# Patient Record
Sex: Male | Born: 2000
Health system: Southern US, Community
[De-identification: ages and names within clinical notes are randomized; demographics above are authoritative.]

## PROBLEM LIST (undated history)

## (undated) DIAGNOSIS — J45909 Unspecified asthma, uncomplicated: Secondary | ICD-10-CM

## (undated) DIAGNOSIS — T7840XA Allergy, unspecified, initial encounter: Secondary | ICD-10-CM

## (undated) HISTORY — DX: Allergy, unspecified, initial encounter: T78.40XA

---
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
## 2019-04-03 ENCOUNTER — Telehealth: Payer: Self-pay | Admitting: Family Medicine

## 2019-04-03 ENCOUNTER — Other Ambulatory Visit: Payer: Self-pay | Admitting: Family Medicine

## 2019-04-03 DIAGNOSIS — Z91030 Bee allergy status: Secondary | ICD-10-CM

## 2019-04-03 MED ORDER — EPINEPHRINE 0.3 MG/0.3ML IJ SOAJ: 0.3000 mg | INTRAMUSCULAR | 1 refills | Status: DC | PRN

## 2019-04-03 MED FILL — EPINEPHRINE 0.3 MG AUTO-INJ: 0.3 | 2 days supply | Qty: 2 | Fill #0

## 2019-04-03 NOTE — Telephone Encounter (Signed)
Rx sent. Thanks, BJ 

## 2019-04-03 NOTE — Telephone Encounter (Signed)
Pt's mom, Rosanne Gutting, is requesting an EpiPen for pt. Pt is allergic to bees and is currently working at [employer]. Pt just wants to be proactive in case he gets stung by a bee. Pt uses [facility]. Thanks

## 2019-04-21 MED FILL — QVAR REDIHALER 40 MCG/ACT A: 40 | 60 days supply | Qty: 11 | Fill #1

## 2019-05-12 MED FILL — MONTELUKAST SOD 10 MG TAB: 10 | 90 days supply | Qty: 90 | Fill #1

## 2019-05-15 ENCOUNTER — Encounter: Payer: Self-pay | Admitting: Family Medicine

## 2019-05-15 ENCOUNTER — Ambulatory Visit (INDEPENDENT_AMBULATORY_CARE_PROVIDER_SITE_OTHER): Payer: No Typology Code available for payment source | Admitting: Family Medicine

## 2019-05-15 ENCOUNTER — Other Ambulatory Visit: Payer: Self-pay

## 2019-05-15 VITALS — BP 126/84 | HR 87 | Temp 97.9°F | Resp 16 | Ht 70.6 in | Wt 358.0 lb

## 2019-05-15 DIAGNOSIS — Z113 Encounter for screening for infections with a predominantly sexual mode of transmission: Secondary | ICD-10-CM | POA: Diagnosis not present

## 2019-05-15 DIAGNOSIS — Z202 Contact with and (suspected) exposure to infections with a predominantly sexual mode of transmission: Secondary | ICD-10-CM | POA: Diagnosis not present

## 2019-05-15 NOTE — Addendum Note (Signed)
Addended by: Bonnye Fava on: 05/15/2019 04:01 PM   Modules accepted: Orders

## 2019-05-15 NOTE — Progress Notes (Signed)
ACUTE VISIT Chief Complaint  Patient presents with  . STD testing    recent exposure to HIV and Herpes   HPI: [name] is a 19 y.o. male, who is here today with above concern. Recently he learned that a male he had sex intercourse with in 12/2018 may be HIV positive and has hx of herpes. He states that he did wear condoms at that time and he always does.. She was his only sex partner at that time. He does not tell me how many he has a this time.  He has not noted fever, night sweats, abnormal weight loss, abdominal pain, diarrhea, urinary symptoms, urethral discharge, a skin rash, or genital lesion. No history of STDs.  Review of Systems  Constitutional: Negative for activity change, appetite change and fatigue.  HENT: Negative for mouth sores and sore throat.   Eyes: Negative for redness and visual disturbance.  Gastrointestinal: Negative for nausea and vomiting.  Genitourinary: Negative for decreased urine volume, dysuria, penile pain and testicular pain.  Musculoskeletal: Negative for gait problem and myalgias.  Skin: Negative for wound.  Neurological: Negative for syncope, weakness, numbness and headaches.  Hematological: Negative for adenopathy. Does not bruise/bleed easily.  Rest see pertinent positives and negatives per HPI.  Current Outpatient Medications on File Prior to Visit  Medication Sig Dispense Refill  . albuterol (PROVENTIL HFA;VENTOLIN HFA) 108 (90 Base) MCG/ACT inhaler Inhale 2 puffs into the lungs every 6 (six) hours as needed for wheezing or shortness of breath. 18 g 1  . beclomethasone (QVAR REDIHALER) 40 MCG/ACT inhaler Inhale 1 puff into the lungs 2 (two) times daily. 10.6 g 6  . cetirizine (ZYRTEC) 10 MG tablet Take 10 mg by mouth daily.    Marland Kitchen EPINEPHrine 0.3 mg/0.3 mL IJ SOAJ injection Inject 0.3 mLs (0.3 mg total) into the muscle as needed for anaphylaxis. 1 each 1  . montelukast (SINGULAIR) 10 MG tablet Take 1 tablet (10 mg  total) by mouth at bedtime. 90 tablet 2   No current facility-administered medications on file prior to visit.     Past Medical History:  Diagnosis Date  . Allergy   . Asthma    Allergies  Allergen Reactions  . Amoxicillin Other (See Comments)    Reaction unknown    Social History   Socioeconomic History  . Marital status: Single    Spouse name: Not on file  . Number of children: Not on file  . Years of education: Not on file  . Highest education level: Not on file  Occupational History  . Not on file  Tobacco Use  . Smoking status: Never Smoker  . Smokeless tobacco: Never Used  Substance and Sexual Activity  . Alcohol use: No  . Drug use: No  . Sexual activity: Yes    Partners: Female  Other Topics Concern  . Not on file  Social History Narrative  . Not on file   Social Determinants of Health   Financial Resource Strain:   . Difficulty of Paying Living Expenses:   Food Insecurity:   . Worried About Charity fundraiser in the Last Year:   . Arboriculturist in the Last Year:   Transportation Needs:   . Film/video editor (Medical):   Marland Kitchen Lack of Transportation (Non-Medical):   Physical Activity:   . Days of Exercise per Week:   . Minutes of Exercise per Session:   Stress:   .  Feeling of Stress :   Social Connections:   . Frequency of Communication with Friends and Family:   . Frequency of Social Gatherings with Friends and Family:   . Attends Religious Services:   . Active Member of Clubs or Organizations:   . Attends Banker Meetings:   Marland Kitchen Marital Status:     Vitals:   05/15/19 1502  BP: 126/84  Pulse: 87  Temp: 97.9 F (36.6 C)  SpO2: 98%   Body mass index is 50.5 kg/m.  Physical Exam  Nursing note and vitals reviewed. Constitutional: He is oriented to person, place, and time. He appears well-developed. No distress.  HENT:  Head: Normocephalic and atraumatic.  Eyes: Conjunctivae are normal.  Cardiovascular: Normal rate  and regular rhythm.  No murmur heard. Respiratory: Effort normal and breath sounds normal. No respiratory distress.  GI: Soft. He exhibits no mass. There is no hepatomegaly. There is no abdominal tenderness.  Lymphadenopathy:    He has no cervical adenopathy.  Neurological: He is alert and oriented to person, place, and time. He has normal strength. No cranial nerve deficit. Gait normal.  Skin: Skin is warm. No rash noted. No erythema.  Psychiatric: He has a normal mood and affect. Cognition and memory are normal.  Well groomed, good eye contact.   ASSESSMENT AND PLAN:  Benjamin Berry was seen today for std testing.  Diagnoses and all orders for this visit:  Screen for STD (sexually transmitted disease) -     Urine cytology ancillary only -     HIV Antibody (routine testing w rflx) -     RPR -     HSV 2 Antibody, IgG  Possible exposure to STD   He is not certain about HIV exposure. STD prevention discussed. Encouraged to wear condoms every time he has sex intercourse, if possible abstinence ifs preferable.  Further recommendation will be given according to lab results.  Return if symptoms worsen or fail to improve.   [name], MD  [facility]. [place] office.  Discharge Instructions   None     A few things to remember from today's visit:   Screen for STD (sexually transmitted disease) - Plan: Urine cytology ancillary only, HIV Antibody (routine testing w rflx), RPR, HSV 2 Antibody, IgG  Possible exposure to STD   Safe Sex Practicing safe sex means taking steps before and during sex to reduce your risk of:  Getting an STI (sexually transmitted infection).  Giving your partner an STI.  Unwanted or unplanned pregnancy. How can I practice safe sex?     Ways you can practice safe sex  Limit your sexual partners to only one partner who is having sex with only you.  Avoid using alcohol and drugs before having sex. Alcohol and drugs can affect  your judgment.  Before having sex with a new partner: ? Talk to your partner about past partners, past STIs, and drug use. ? Get screened for STIs and discuss the results with your partner. Ask your partner to get screened, too.  Check your body regularly for sores, blisters, rashes, or unusual discharge. If you notice any of these problems, visit your health care provider.  Avoid sexual contact if you have symptoms of an infection or you are being treated for an STI.  While having sex, use a condom. Make sure to: ? Use a condom every time you have vaginal, oral, or anal sex. Both females and males should wear condoms during oral sex. ?  Keep condoms in place from the beginning to the end of sexual activity. ? Use a latex condom, if possible. Latex condoms offer the best protection. ? Use only water-based lubricants with a condom. Using petroleum-based lubricants or oils will weaken the condom and increase the chance that it will break. Ways your health care provider can help you practice safe sex  See your health care provider for regular screenings, exams, and tests for STIs.  Talk with your health care provider about what kind of birth control (contraception) is best for you.  Get vaccinated against hepatitis B and human papillomavirus (HPV).  If you are at risk of being infected with HIV (human immunodeficiency virus), talk with your health care provider about taking a prescription medicine to prevent HIV infection. You are at risk for HIV if you: ? Are a man who has sex with other men. ? Are sexually active with more than one partner. ? Take drugs by injection. ? Have a sex partner who has HIV. ? Have unprotected sex. ? Have sex with someone who has sex with both men and women. ? Have had an STI. Follow these instructions at home:  Take over-the-counter and prescription medicines as told by your health care provider.  Keep all follow-up visits as told by your health care  provider. This is important. Where to find more information  Centers for Disease Control and Prevention: LessFurniture.be  Planned Parenthood: https://www.plannedparenthood.org/  Office on Women's Health: EmploymentTracking.tn Summary  Practicing safe sex means taking steps before and during sex to reduce your risk of STIs, giving your partner STIs, and having an unwanted or unplanned pregnancy.  Before having sex with a new partner, talk to your partner about past partners, past STIs, and drug use.  Use a condom every time you have vaginal, oral, or anal sex. Both females and males should wear condoms during oral sex.  Check your body regularly for sores, blisters, rashes, or unusual discharge. If you notice any of these problems, visit your health care provider.  See your health care provider for regular screenings, exams, and tests for STIs. This information is not intended to replace advice given to you by your health care provider. Make sure you discuss any questions you have with your health care provider. Document Revised: 04/18/2018 Document Reviewed: 10/07/2017 Elsevier Patient Education  [employer].  If you need refills please call your pharmacy. Do not use My Chart to request refills or for acute issues that need immediate attention.    Please be sure medication list is accurate. If a new problem present, please set up appointment sooner than planned today.

## 2019-05-15 NOTE — Patient Instructions (Signed)
A few things to remember from today's visit:   Screen for STD (sexually transmitted disease) - Plan: Urine cytology ancillary only, HIV Antibody (routine testing w rflx), RPR, HSV 2 Antibody, IgG  Possible exposure to STD   Safe Sex Practicing safe sex means taking steps before and during sex to reduce your risk of:  Getting an STI (sexually transmitted infection).  Giving your partner an STI.  Unwanted or unplanned pregnancy. How can I practice safe sex?     Ways you can practice safe sex  Limit your sexual partners to only one partner who is having sex with only you.  Avoid using alcohol and drugs before having sex. Alcohol and drugs can affect your judgment.  Before having sex with a new partner: ? Talk to your partner about past partners, past STIs, and drug use. ? Get screened for STIs and discuss the results with your partner. Ask your partner to get screened, too.  Check your body regularly for sores, blisters, rashes, or unusual discharge. If you notice any of these problems, visit your health care provider.  Avoid sexual contact if you have symptoms of an infection or you are being treated for an STI.  While having sex, use a condom. Make sure to: ? Use a condom every time you have vaginal, oral, or anal sex. Both females and males should wear condoms during oral sex. ? Keep condoms in place from the beginning to the end of sexual activity. ? Use a latex condom, if possible. Latex condoms offer the best protection. ? Use only water-based lubricants with a condom. Using petroleum-based lubricants or oils will weaken the condom and increase the chance that it will break. Ways your health care provider can help you practice safe sex  See your health care provider for regular screenings, exams, and tests for STIs.  Talk with your health care provider about what kind of birth control (contraception) is best for you.  Get vaccinated against hepatitis B and human  papillomavirus (HPV).  If you are at risk of being infected with HIV (human immunodeficiency virus), talk with your health care provider about taking a prescription medicine to prevent HIV infection. You are at risk for HIV if you: ? Are a man who has sex with other men. ? Are sexually active with more than one partner. ? Take drugs by injection. ? Have a sex partner who has HIV. ? Have unprotected sex. ? Have sex with someone who has sex with both men and women. ? Have had an STI. Follow these instructions at home:  Take over-the-counter and prescription medicines as told by your health care provider.  Keep all follow-up visits as told by your health care provider. This is important. Where to find more information  Centers for Disease Control and Prevention: PinkCheek.be  Planned Parenthood: https://www.plannedparenthood.org/  Office on Women's Health: BasketballVoice.it Summary  Practicing safe sex means taking steps before and during sex to reduce your risk of STIs, giving your partner STIs, and having an unwanted or unplanned pregnancy.  Before having sex with a new partner, talk to your partner about past partners, past STIs, and drug use.  Use a condom every time you have vaginal, oral, or anal sex. Both females and males should wear condoms during oral sex.  Check your body regularly for sores, blisters, rashes, or unusual discharge. If you notice any of these problems, visit your health care provider.  See your health care provider for regular screenings, exams, and tests  for STIs. This information is not intended to replace advice given to you by your health care provider. Make sure you discuss any questions you have with your health care provider. Document Revised: 04/18/2018 Document Reviewed: 10/07/2017 Elsevier Patient Education  [employer].  If you need refills please call  your pharmacy. Do not use My Chart to request refills or for acute issues that need immediate attention.    Please be sure medication list is accurate. If a new problem present, please set up appointment sooner than planned today.

## 2019-05-18 LAB — HIV ANTIBODY (ROUTINE TESTING W REFLEX): HIV 1&2 Ab, 4th Generation: NONREACTIVE

## 2019-05-18 LAB — HSV 2 ANTIBODY, IGG: HSV 2 Glycoprotein G Ab, IgG: 0.98 index — ABNORMAL HIGH

## 2019-05-18 LAB — C. TRACHOMATIS/N. GONORRHOEAE RNA
C. trachomatis RNA, TMA: NOT DETECTED
N. gonorrhoeae RNA, TMA: NOT DETECTED

## 2019-05-18 LAB — RPR: RPR Ser Ql: NONREACTIVE

## 2019-08-17 ENCOUNTER — Ambulatory Visit: Payer: No Typology Code available for payment source | Admitting: Family Medicine

## 2019-08-24 ENCOUNTER — Other Ambulatory Visit: Payer: Self-pay

## 2019-08-24 ENCOUNTER — Encounter: Payer: Self-pay | Admitting: Family Medicine

## 2019-08-24 ENCOUNTER — Ambulatory Visit (INDEPENDENT_AMBULATORY_CARE_PROVIDER_SITE_OTHER): Payer: No Typology Code available for payment source | Admitting: Family Medicine

## 2019-08-24 VITALS — BP 128/80 | HR 74 | Resp 12 | Ht 70.6 in | Wt 339.0 lb

## 2019-08-24 DIAGNOSIS — J4520 Mild intermittent asthma, uncomplicated: Secondary | ICD-10-CM | POA: Diagnosis not present

## 2019-08-24 DIAGNOSIS — J309 Allergic rhinitis, unspecified: Secondary | ICD-10-CM | POA: Diagnosis not present

## 2019-08-24 MED ORDER — BUDESONIDE-FORMOTEROL FUMARATE 160-4.5 MCG/ACT IN AERO: 2.0000 | INHALATION_SPRAY | Freq: Two times a day (BID) | RESPIRATORY_TRACT | 3 refills | Status: DC

## 2019-08-24 MED ORDER — MONTELUKAST SODIUM 10 MG PO TABS: 10.0000 mg | ORAL_TABLET | Freq: Every day | ORAL | 2 refills | Status: DC

## 2019-08-24 NOTE — Assessment & Plan Note (Signed)
Problem is well controlled. Continue Symbicort 160-4.5 mcg twice daily and albuterol inhaler 2 puff every 4-6 hours as needed. Weight loss will help and strongly recommended. He can follow in 1 year, before if needed.

## 2019-08-24 NOTE — Assessment & Plan Note (Signed)
He has lost about 19 pounds since his last visit in 05/2019. Encouraged to be consistent with regular physical activity and a healthful diet. We discussed benefits of weight loss.

## 2019-08-24 NOTE — Assessment & Plan Note (Signed)
Problem is well controlled. Continue Singulair 10 mg daily and nasal saline irrigations as needed. Recommend taking Singulair during months,when symptoms are more prevalent. Continue annual follow-ups.

## 2019-08-24 NOTE — Progress Notes (Signed)
HPI: Benjamin Berry is a 19 y.o. male, who is here today for 6 months follow up.   He was last seen on 05/15/2019 for acute visit. Last follow-up on 12/29/2018, Qvar was recommended. His mother has Symbicort, which he used and felt like it helped more than Qvar.  Currently he is on Symbicort 160-4.5 mcg twice daily. He is using Albuterol inh 0-3 times per week. Negative for cough, dyspnea, wheezing, or CP.  Negative for nocturnal symptoms.  Symptoms are exacerbated by physical activity in humid weather.  Allergy rhinitis: He is also on Singulair 10 mg daily. At this time he is not having nasal congestion, rhinorrhea, or postnasal drainage.  He has increased physical activity and try to eat healthier, therefore he has lost some weight.  Review of Systems  Constitutional: Negative for activity change, appetite change, fatigue and fever.  HENT: Negative for mouth sores, nosebleeds and sore throat.   Eyes: Negative for redness and visual disturbance.  Gastrointestinal: Negative for abdominal pain, nausea and vomiting.  Skin: Negative for pallor and rash.  Allergic/Immunologic: Positive for environmental allergies.  Neurological: Negative for syncope, weakness and headaches.  Rest of ROS, see pertinent positives sand negatives in HPI  Current Outpatient Medications on File Prior to Visit  Medication Sig Dispense Refill  . albuterol (PROVENTIL HFA;VENTOLIN HFA) 108 (90 Base) MCG/ACT inhaler Inhale 2 puffs into the lungs every 6 (six) hours as needed for wheezing or shortness of breath. 18 g 1  . cetirizine (ZYRTEC) 10 MG tablet Take 10 mg by mouth daily.    Marland Kitchen EPINEPHrine 0.3 mg/0.3 mL IJ SOAJ injection Inject 0.3 mLs (0.3 mg total) into the muscle as needed for anaphylaxis. 1 each 1   No current facility-administered medications on file prior to visit.   Past Medical History:  Diagnosis Date  . Allergy   . Asthma    Allergies  Allergen Reactions  . Amoxicillin Other (See  Comments)    Reaction unknown    Social History   Socioeconomic History  . Marital status: Single    Spouse name: Not on file  . Number of children: Not on file  . Years of education: Not on file  . Highest education level: Not on file  Occupational History  . Not on file  Tobacco Use  . Smoking status: Never Smoker  . Smokeless tobacco: Never Used  Vaping Use  . Vaping Use: Never used  Substance and Sexual Activity  . Alcohol use: No  . Drug use: No  . Sexual activity: Yes    Partners: Female  Other Topics Concern  . Not on file  Social History Narrative  . Not on file   Social Determinants of Health   Financial Resource Strain:   . Difficulty of Paying Living Expenses:   Food Insecurity:   . Worried About Programme researcher, broadcasting/film/video in the Last Year:   . Barista in the Last Year:   Transportation Needs:   . Freight forwarder (Medical):   Marland Kitchen Lack of Transportation (Non-Medical):   Physical Activity:   . Days of Exercise per Week:   . Minutes of Exercise per Session:   Stress:   . Feeling of Stress :   Social Connections:   . Frequency of Communication with Friends and Family:   . Frequency of Social Gatherings with Friends and Family:   . Attends Religious Services:   . Active Member of Clubs or Organizations:   .  Attends Banker Meetings:   Marland Kitchen Marital Status:    Vitals:   08/24/19 1616  BP: 128/80  Pulse: 74  Resp: 12  SpO2: 97%   Wt Readings from Last 3 Encounters:  08/24/19 (!) 339 lb (153.8 kg)  05/15/19 (!) 358 lb (162.4 kg)  12/29/18 (!) 356 lb (161.5 kg) (>99 %, Z= 3.60)*   * Growth percentiles are based on CDC (Boys, 2-20 Years) data.   Body mass index is 47.82 kg/m.  Physical Exam Nursing note reviewed.  Constitutional:      General: He is not in acute distress.    Appearance: He is well-developed.  HENT:     Head: Normocephalic and atraumatic.     Mouth/Throat:     Mouth: Mucous membranes are moist.     Pharynx:  Oropharynx is clear.  Eyes:     Conjunctiva/sclera: Conjunctivae normal.     Pupils: Pupils are equal, round, and reactive to light.  Cardiovascular:     Rate and Rhythm: Normal rate and regular rhythm.     Heart sounds: No murmur heard.   Pulmonary:     Effort: Pulmonary effort is normal. No respiratory distress.     Breath sounds: Normal breath sounds.  Lymphadenopathy:     Cervical: No cervical adenopathy.  Skin:    General: Skin is warm.     Findings: No erythema or rash.  Neurological:     Mental Status: He is alert and oriented to person, place, and time.     Cranial Nerves: No cranial nerve deficit.     Gait: Gait normal.  Psychiatric:        Mood and Affect: Mood and affect normal.     Comments: Well groomed, good eye contact.   ASSESSMENT AND PLAN:  Benjamin Berry was seen today for 6 months follow-up.  No orders of the defined types were placed in this encounter.  Morbid obesity (HCC) He has lost about 19 pounds since his last visit in 05/2019. Encouraged to be consistent with regular physical activity and a healthful diet. We discussed benefits of weight loss.  Asthma, intermittent, uncomplicated Problem is well controlled. Continue Symbicort 160-4.5 mcg twice daily and albuterol inhaler 2 puff every 4-6 hours as needed. Weight loss will help and strongly recommended. He can follow in 1 year, before if needed.  Allergic rhinitis Problem is well controlled. Continue Singulair 10 mg daily and nasal saline irrigations as needed. Recommend taking Singulair during months,when symptoms are more prevalent. Continue annual follow-ups.   Return in about 1 year (around 08/23/2020) for cpe and f/u.  Sherie Dobrowolski G. [place], MD  [facility]. [place] office.

## 2019-08-24 NOTE — Patient Instructions (Signed)
A few things to remember from today's visit: No changes today. Continue regular physical activity , 150 min per week.  If you need refills please call your pharmacy. Do not use My Chart to request refills or for acute issues that need immediate attention.    Please be sure medication list is accurate. If a new problem present, please set up appointment sooner than planned today.

## 2019-08-27 ENCOUNTER — Telehealth: Payer: Self-pay | Admitting: Family Medicine

## 2019-08-27 DIAGNOSIS — J4520 Mild intermittent asthma, uncomplicated: Secondary | ICD-10-CM

## 2019-08-27 DIAGNOSIS — J309 Allergic rhinitis, unspecified: Secondary | ICD-10-CM

## 2019-08-27 NOTE — Telephone Encounter (Signed)
Pt medication should have been sent to [facility] out patient. Wants it transferred there.  budesonide-formoterol (SYMBICORT) 160-4.5 MCG/ACT inhaler   montelukast (SINGULAIR) 10 MG tablet   Please advise

## 2019-08-28 ENCOUNTER — Other Ambulatory Visit: Payer: Self-pay | Admitting: Family Medicine

## 2019-08-28 MED ORDER — BUDESONIDE-FORMOTEROL FUMARATE 160-4.5 MCG/ACT IN AERO: 2.0000 | INHALATION_SPRAY | Freq: Two times a day (BID) | RESPIRATORY_TRACT | 3 refills | Status: DC

## 2019-08-28 MED ORDER — MONTELUKAST SODIUM 10 MG PO TABS: 10.0000 mg | ORAL_TABLET | Freq: Every day | ORAL | 2 refills | Status: DC

## 2019-08-28 MED FILL — SYMBICORT 160-4.5 MCG INH: 160-4.5 | 30 days supply | Qty: 10 | Fill #0

## 2019-08-28 MED FILL — MONTELUKAST SOD 10 MG TAB: 10 | 90 days supply | Qty: 90 | Fill #0

## 2019-08-28 NOTE — Telephone Encounter (Signed)
Rx's sent in as requested. 

## 2019-09-23 MED FILL — MONTELUKAST SOD 10 MG TAB: 10 | 90 days supply | Qty: 90 | Fill #2

## 2019-11-10 MED FILL — SYMBICORT 160-4.5 MCG INH: 160-4.5 | 30 days supply | Qty: 10 | Fill #1

## 2019-12-14 MED FILL — SYMBICORT 160-4.5 MCG INH: 160-4.5 | 30 days supply | Qty: 10 | Fill #2

## 2020-02-02 MED FILL — MONTELUKAST SOD 10 MG TAB: 10 | 90 days supply | Qty: 90 | Fill #0

## 2020-02-24 ENCOUNTER — Encounter: Payer: Self-pay | Admitting: Family Medicine

## 2020-02-24 ENCOUNTER — Ambulatory Visit (INDEPENDENT_AMBULATORY_CARE_PROVIDER_SITE_OTHER): Payer: 59 | Admitting: Family Medicine

## 2020-02-24 ENCOUNTER — Encounter: Payer: No Typology Code available for payment source | Admitting: Family Medicine

## 2020-02-24 ENCOUNTER — Other Ambulatory Visit: Payer: Self-pay

## 2020-02-24 VITALS — BP 128/80 | HR 69 | Resp 12 | Ht 70.6 in | Wt 333.4 lb

## 2020-02-24 DIAGNOSIS — Z23 Encounter for immunization: Secondary | ICD-10-CM | POA: Diagnosis not present

## 2020-02-24 DIAGNOSIS — Z0000 Encounter for general adult medical examination without abnormal findings: Secondary | ICD-10-CM | POA: Diagnosis not present

## 2020-02-24 DIAGNOSIS — Z13228 Encounter for screening for other metabolic disorders: Secondary | ICD-10-CM | POA: Diagnosis not present

## 2020-02-24 DIAGNOSIS — Z130 Encounter for screening for diseases of the blood and blood-forming organs and certain disorders involving the immune mechanism: Secondary | ICD-10-CM

## 2020-02-24 DIAGNOSIS — Z1159 Encounter for screening for other viral diseases: Secondary | ICD-10-CM

## 2020-02-24 DIAGNOSIS — J4520 Mild intermittent asthma, uncomplicated: Secondary | ICD-10-CM

## 2020-02-24 DIAGNOSIS — Z13220 Encounter for screening for lipoid disorders: Secondary | ICD-10-CM | POA: Diagnosis not present

## 2020-02-24 DIAGNOSIS — Z1329 Encounter for screening for other suspected endocrine disorder: Secondary | ICD-10-CM | POA: Diagnosis not present

## 2020-02-24 LAB — HEPATITIS C ANTIBODY
Hepatitis C Ab: NONREACTIVE
SIGNAL TO CUT-OFF: 0.05 (ref <1.00)

## 2020-02-24 LAB — BASIC METABOLIC PANEL
BUN: 15 mg/dL (ref 6–23)
CO2: 25 mEq/L (ref 19–32)
Calcium: 9.6 mg/dL (ref 8.4–10.5)
Chloride: 105 mEq/L (ref 96–112)
Creatinine, Ser: 0.89 mg/dL (ref 0.40–1.50)
GFR: 122.98 mL/min (ref >60.00)
Glucose, Bld: 86 mg/dL (ref 70–99)
Potassium: 4.1 mEq/L (ref 3.5–5.1)
Sodium: 137 mEq/L (ref 135–145)

## 2020-02-24 LAB — LIPID PANEL
Cholesterol: 153 mg/dL (ref 0–200)
HDL: 48.9 mg/dL (ref >39.00)
LDL Cholesterol: 88 mg/dL (ref 0–99)
NonHDL: 103.65
Total CHOL/HDL Ratio: 3
Triglycerides: 76 mg/dL (ref 0.0–149.0)
VLDL: 15.2 mg/dL (ref 0.0–40.0)

## 2020-02-24 LAB — HEMOGLOBIN A1C: Hgb A1c MFr Bld: 5.6 % (ref 4.6–6.5)

## 2020-02-24 MED ORDER — BUDESONIDE-FORMOTEROL FUMARATE 160-4.5 MCG/ACT IN AERO
2.0000 | INHALATION_SPRAY | Freq: Two times a day (BID) | RESPIRATORY_TRACT | 3 refills | Status: DC
Start: 2020-02-24 — End: 2020-02-26

## 2020-02-24 NOTE — Patient Instructions (Addendum)
A few things to remember from today's visit:  Routine general medical examination at a health care facility  Encounter for HCV screening test for low risk patient - Plan: Hepatitis C antibody screen  Asthma, intermittent, uncomplicated  Screening for lipoid disorders - Plan: Lipid panel  Screening for endocrine, metabolic and immunity disorder - Plan: Hemoglobin A1c, Basic metabolic panel  If you need refills please call your pharmacy. Do not use My Chart to request refills or for acute issues that need immediate attention.   Please be sure medication list is accurate. If a new problem present, please set up appointment sooner than planned today.  At least 150 minutes of moderate exercise per week, daily brisk walking for 15-30 min is a good exercise option. Healthy diet low in saturated (animal) fats and sweets and consisting of fresh fruits and vegetables, lean meats such as fish and white chicken and whole grains.  - Vaccines:  Tdap vaccine every 10 years.  Shingles vaccine recommended at age 90, could be given after 21 years of age but not sure about insurance coverage.  Pneumonia vaccines: Pneumovax at 27  -Screening recommendations for low/normal risk males:  Screening for diabetes at age 30 and every 3 years. Earlier screening if cardiovascular risk factors.   Lipid screening at 35 and every 3 years. Screening starts in younger males with cardiovascular risk factors.  Colon cancer screening is now at age 7 but your insurance may not cover until age 75 .screening is recommended age 47.  Prostate cancer screening: some controversy, starts usually at 50: Rectal exam and PSA.  Aortic Abdominal Aneurism once between 74 and 57 years old if ever smoker.  Also recommended:  1. Dental visit- Brush and floss your teeth twice daily; visit your dentist twice a year. 2. Eye doctor- Get an eye exam at least every 2 years. 3. Helmet use- Always wear a helmet when riding a  bicycle, motorcycle, rollerblading or skateboarding. 4. Safe sex- If you may be exposed to sexually transmitted infections, use a condom. 5. Seat belts- Seat belts can save your live; always wear one. 6. Smoke/Carbon Monoxide detectors- These detectors need to be installed on the appropriate level of your home. Replace batteries at least once a year. 7. Skin cancer- When out in the sun please cover up and use sunscreen 15 SPF or higher. 8. Violence- If anyone is threatening or hurting you, please tell your healthcare provider.  9. Drink alcohol in moderation- Limit alcohol intake to one drink or less per day. Never drink and drive.     Why follow it? Research shows. . Those who follow the Mediterranean diet have a reduced risk of heart disease  . The diet is associated with a reduced incidence of Parkinson's and Alzheimer's diseases . People following the diet may have longer life expectancies and lower rates of chronic diseases  . The Dietary Guidelines for Americans recommends the Mediterranean diet as an eating plan to promote health and prevent disease  What Is the Mediterranean Diet?  . Healthy eating plan based on typical foods and recipes of Mediterranean-style cooking . The diet is primarily a plant based diet; these foods should make up a majority of meals   Starches - Plant based foods should make up a majority of meals - They are an important sources of vitamins, minerals, energy, antioxidants, and fiber - Choose whole grains, foods high in fiber and minimally processed items  - Typical grain sources include wheat, oats, barley, corn,  brown rice, bulgar, farro, millet, polenta, couscous  - Various types of beans include chickpeas, lentils, fava beans, black beans, white beans   Fruits  Veggies - Large quantities of antioxidant rich fruits & veggies; 6 or more servings  - Vegetables can be eaten raw or lightly drizzled with oil and cooked  - Vegetables common to the traditional  Mediterranean Diet include: artichokes, arugula, beets, broccoli, brussel sprouts, cabbage, carrots, celery, collard greens, cucumbers, eggplant, kale, leeks, lemons, lettuce, mushrooms, okra, onions, peas, peppers, potatoes, pumpkin, radishes, rutabaga, shallots, spinach, sweet potatoes, turnips, zucchini - Fruits common to the Mediterranean Diet include: apples, apricots, avocados, cherries, clementines, dates, figs, grapefruits, grapes, melons, nectarines, oranges, peaches, pears, pomegranates, strawberries, tangerines  Fats - Replace butter and margarine with healthy oils, such as olive oil, canola oil, and tahini  - Limit nuts to no more than a handful a day  - Nuts include walnuts, almonds, pecans, pistachios, pine nuts  - Limit or avoid candied, honey roasted or heavily salted nuts - Olives are central to the [employer] - can be eaten whole or used in a variety of dishes   Meats Protein - Limiting red meat: no more than a few times a month - When eating red meat: choose lean cuts and keep the portion to the size of deck of cards - Eggs: approx. 0 to 4 times a week  - Fish and lean poultry: at least 2 a week  - Healthy protein sources include, chicken, [place], lean beef, lamb - Increase intake of seafood such as tuna, salmon, trout, mackerel, shrimp, scallops - Avoid or limit high fat processed meats such as sausage and bacon  Dairy - Include moderate amounts of low fat dairy products  - Focus on healthy dairy such as fat free yogurt, skim milk, low or reduced fat cheese - Limit dairy products higher in fat such as whole or 2% milk, cheese, ice cream  Alcohol - Moderate amounts of red wine is ok  - No more than 5 oz daily for women (all ages) and men older than age 23  - No more than 10 oz of wine daily for men younger than 61  Other - Limit sweets and other desserts  - Use herbs and spices instead of salt to flavor foods  - Herbs and spices common to the traditional Mediterranean  Diet include: basil, bay leaves, chives, cloves, cumin, fennel, garlic, lavender, marjoram, mint, oregano, parsley, pepper, rosemary, sage, savory, sumac, tarragon, thyme   It's not just a diet, it's a lifestyle:  . The Mediterranean diet includes lifestyle factors typical of those in the region  . Foods, drinks and meals are best eaten with others and savored . Daily physical activity is important for overall good health . This could be strenuous exercise like running and aerobics . This could also be more leisurely activities such as walking, housework, yard-work, or taking the stairs . Moderation is the key; a balanced and healthy diet accommodates most foods and drinks . Consider portion sizes and frequency of consumption of certain foods   Meal Ideas & Options:  . Breakfast:  o Whole wheat toast or whole wheat English muffins with peanut butter & hard boiled egg o Steel cut oats topped with apples & cinnamon and skim milk  o Fresh fruit: banana, strawberries, melon, berries, peaches  o Smoothies: strawberries, bananas, greek yogurt, peanut butter o Low fat greek yogurt with blueberries and granola  o Egg white omelet with spinach and  mushrooms o Breakfast couscous: whole wheat couscous, apricots, skim milk, cranberries  . Sandwiches:  o Hummus and grilled vegetables (peppers, zucchini, squash) on whole wheat bread   o Grilled chicken on whole wheat pita with lettuce, tomatoes, cucumbers or tzatziki  o Tuna salad on whole wheat bread: tuna salad made with greek yogurt, olives, red peppers, capers, green onions o Garlic rosemary lamb pita: lamb sauted with garlic, rosemary, salt & pepper; add lettuce, cucumber, greek yogurt to pita - flavor with lemon juice and black pepper  . Seafood:  o Mediterranean grilled salmon, seasoned with garlic, basil, parsley, lemon juice and black pepper o Shrimp, lemon, and spinach whole-grain pasta salad made with low fat greek yogurt  o Seared scallops  with lemon orzo  o Seared tuna steaks seasoned salt, pepper, coriander topped with tomato mixture of olives, tomatoes, olive oil, minced garlic, parsley, green onions and cappers  . Meats:  o Herbed greek chicken salad with kalamata olives, cucumber, feta  o Red bell peppers stuffed with spinach, bulgur, lean ground beef (or lentils) & topped with feta   o Kebabs: skewers of chicken, tomatoes, onions, zucchini, squash  o [place] burgers: made with red onions, mint, dill, lemon juice, feta cheese topped with roasted red peppers . Vegetarian o Cucumber salad: cucumbers, artichoke hearts, celery, red onion, feta cheese, tossed in olive oil & lemon juice  o Hummus and whole grain pita points with a greek salad (lettuce, tomato, feta, olives, cucumbers, red onion) o Lentil soup with celery, carrots made with vegetable broth, garlic, salt and pepper  o Tabouli salad: parsley, bulgur, mint, scallions, cucumbers, tomato, radishes, lemon juice, olive oil, salt and pepper.

## 2020-02-24 NOTE — Progress Notes (Signed)
HPI:  Benjamin Berry is a 20 y.o.male here today for Benjamin Berry routine physical examination.  Last CPE: 12/29/18. No new problems since Benjamin Berry last visit.  Benjamin Berry lives with Benjamin Berry parents. Benjamin Berry was working for [employer], [place] for new job. 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  Regular exercise 3 or more times per week: Back to the gym 01/2020. Following a healthy diet: Benjamin Berry is eating healthier.  Chronic medical problems: Asthma,allergic rhinitis,and obesity.  Benjamin Berry is on Singulair 10 mg daily, Symbicort 160-4.5 mcg bid,and Albuterol inh prn. Benjamin Berry has not needed Albuterol inh in a while.  Benjamin Berry is not currently sexually active. Hx of STD's: Negative.  Immunization History  Administered Date(s) Administered  . DTaP 05/24/1999, 07/24/1999, 09/25/1999, 07/03/2000, 08/11/2003  . Hepatitis A 06/27/2005, 01/04/2006  . Hepatitis B 10/14/99, 04/19/1999, 09/25/1999  . HiB (PRP-OMP) 05/24/1999, 07/24/1999, 09/25/1999, 03/18/2000  . Hpv 09/03/2008, 06/30/2010, 07/30/2014  . IPV 05/24/1999, 07/24/1999, 09/25/1999, 08/11/2003  . Influenza,inj,Quad PF,6+ Mos 11/11/2017, 12/29/2018  . MMR 03/18/2000, 08/11/2003  . Meningococcal Conjugate 06/30/2010, 11/11/2017  . Meningococcal Mcv4o 08/14/2016  . Pneumococcal Conjugate-13 05/24/1999, 07/24/1999, 09/25/1999, 03/18/2000  . Tdap 06/30/2010, 02/24/2020  . Varicella 03/18/2000, 06/27/2005   -Hep C screening: Never. Last colon cancer screening: N/A Last prostate ca screening: N/A Negative for high alcohol intake, tobacco use, or Hx of illicit drug use.  Benjamin Berry has no concerns today.  Review of Systems  Constitutional: Negative for activity change, appetite change, fatigue, fever and unexpected weight change.  HENT: Negative for nosebleeds, sore throat and trouble swallowing.   Eyes: Negative for redness and visual disturbance.  Respiratory: Negative for apnea, cough, shortness of breath and wheezing.   Cardiovascular: Negative for chest pain,  palpitations and leg swelling.  Gastrointestinal: Negative for abdominal pain, nausea and vomiting.  Endocrine: Negative for cold intolerance, heat intolerance, polydipsia, polyphagia and polyuria.  Genitourinary: Negative for decreased urine volume, dysuria and hematuria.  Allergic/Immunologic: Positive for environmental allergies.  Neurological: Negative for dizziness, seizures, weakness, numbness and headaches.  All other systems reviewed and are negative.   Current Outpatient Medications on File Prior to Visit  Medication Sig Dispense Refill  . albuterol (PROVENTIL HFA;VENTOLIN HFA) 108 (90 Base) MCG/ACT inhaler Inhale 2 puffs into the lungs every 6 (six) hours as needed for wheezing or shortness of breath. 18 g 1  . cetirizine (ZYRTEC) 10 MG tablet Take 10 mg by mouth daily.    Marland Kitchen EPINEPHrine 0.3 mg/0.3 mL IJ SOAJ injection Inject 0.3 mLs (0.3 mg total) into the muscle as needed for anaphylaxis. 1 each 1   No current facility-administered medications on file prior to visit.   Past Medical History:  Diagnosis Date  . Allergy   . Asthma    History reviewed. No pertinent surgical history.  Allergies  Allergen Reactions  . Amoxicillin Other (See Comments)    Reaction unknown    Family History  Problem Relation Age of Onset  . Diabetes Mother   . Asthma Mother   . Hyperlipidemia Mother   . Hypertension Father     Social History   Socioeconomic History  . Marital status: Single    Spouse name: Not on file  . Number of children: Not on file  . Years of education: Not on file  . Highest education level: Not on file  Occupational History  . Not on file  Tobacco Use  . Smoking status: Never Smoker  . Smokeless tobacco: Never Used  Vaping Use  . Vaping Use:  Never used  Substance and Sexual Activity  . Alcohol use: No  . Drug use: No  . Sexual activity: Yes    Partners: Female  Other Topics Concern  . Not on file  Social History Narrative  . Not on file   Social  Determinants of Health   Financial Resource Strain: Not on file  Food Insecurity: Not on file  Transportation Needs: Not on file  Physical Activity: Not on file  Stress: Not on file  Social Connections: Not on file   Vitals:   02/24/20 0855  BP: 128/80  Pulse: 69  Resp: 12  SpO2: 98%   Body mass index is 47.02 kg/m.  Wt Readings from Last 3 Encounters:  02/24/20 (!) 333 lb 6 oz (151.2 kg)  08/24/19 (!) 339 lb (153.8 kg)  05/15/19 (!) 358 lb (162.4 kg)   Physical Exam Vitals and nursing note reviewed.  Constitutional:      General: Benjamin Berry is not in acute distress.    Appearance: Benjamin Berry is well-developed.  HENT:     Head: Atraumatic.     Right Ear: Tympanic membrane, ear canal and external ear normal.     Left Ear: Tympanic membrane, ear canal and external ear normal.     Mouth/Throat:     Mouth: Oropharynx is clear and moist and mucous membranes are normal.  Eyes:     Extraocular Movements: EOM normal.     Conjunctiva/sclera: Conjunctivae normal.     Pupils: Pupils are equal, round, and reactive to light.  Neck:     Thyroid: No thyromegaly.     Trachea: No tracheal deviation.  Cardiovascular:     Rate and Rhythm: Normal rate and regular rhythm.     Pulses:          Dorsalis pedis pulses are 2+ on the right side and 2+ on the left side.     Heart sounds: No murmur heard.   Pulmonary:     Effort: Pulmonary effort is normal. No respiratory distress.     Breath sounds: Normal breath sounds.  Chest:  Breasts:     Right: No supraclavicular adenopathy.     Left: No supraclavicular adenopathy.    Abdominal:     Palpations: Abdomen is soft. There is no hepatomegaly or mass.     Tenderness: There is no abdominal tenderness.  Genitourinary:    Comments: No concerns. Musculoskeletal:        General: No tenderness or edema.     Cervical back: Normal range of motion.     Comments: No major deformities appreciated and no signs of synovitis.  Lymphadenopathy:     Cervical:  No cervical adenopathy.     Upper Body:     Right upper body: No supraclavicular adenopathy.     Left upper body: No supraclavicular adenopathy.  Skin:    General: Skin is warm.     Findings: No erythema.  Neurological:     Mental Status: Benjamin Berry is alert and oriented to person, place, and time.     Cranial Nerves: No cranial nerve deficit.     Sensory: No sensory deficit.     Coordination: Coordination normal.     Gait: Gait normal.     Deep Tendon Reflexes: Strength normal.     Reflex Scores:      Bicep reflexes are 2+ on the right side and 2+ on the left side.      Patellar reflexes are 2+ on the right side and 2+  on the left side. Psychiatric:        Mood and Affect: Mood and affect normal.        Cognition and Memory: Cognition and memory normal.    ASSESSMENT AND PLAN:  Breion was seen today for annual exam.  Diagnoses and all orders for this visit: Orders Placed This Encounter  Procedures  . Tdap vaccine greater than or equal to 7yo IM  . Lipid panel  . Hemoglobin A1c  . Hepatitis C antibody screen  . Basic metabolic panel    Routine general medical examination at a health care facility We discussed the importance of regular physical activity and healthy diet for prevention of chronic illness and/or complications. Preventive guidelines reviewed. Vaccination updated. STD prevention discussed. Next CPE in a year.  Encounter for HCV screening test for low risk patient -     Hepatitis C antibody screen  Asthma, intermittent, uncomplicated Well controlled. Continue current management. Wt loss will also help.  Screening for lipoid disorders -     Lipid panel  Screening for endocrine, metabolic and immunity disorder -     Basic metabolic panel -     Hemoglobin A1c  Morbid obesity ([place]) Benjamin Berry understands the benefits of wt loss as well as adverse effects of obesity. Consistency with healthy diet and physical activity recommended.  Need for Tdap vaccination -      Tdap vaccine greater than or equal to 7yo IM   Return in 1 year (on 02/23/2021) for CPE ad=nd follow up.   Benjamin Craine G. [place], MD  [facility]. [place] office.  A few things to remember from today's visit:  Routine general medical examination at a health care facility  Encounter for HCV screening test for low risk patient - Plan: Hepatitis C antibody screen  Asthma, intermittent, uncomplicated  Screening for lipoid disorders - Plan: Lipid panel  Screening for endocrine, metabolic and immunity disorder - Plan: Hemoglobin U7M, Basic metabolic panel  If you need refills please call your pharmacy. Do not use My Chart to request refills or for acute issues that need immediate attention.   Please be sure medication list is accurate. If a new problem present, please set up appointment sooner than planned today.  At least 150 minutes of moderate exercise per week, daily brisk walking for 15-30 min is a good exercise option. Healthy diet low in saturated (animal) fats and sweets and consisting of fresh fruits and vegetables, lean meats such as fish and white chicken and whole grains.  - Vaccines:  Tdap vaccine every 10 years.  Shingles vaccine recommended at age 72, could be given after 21 years of age but not sure about insurance coverage.  Pneumonia vaccines: Pneumovax at 12  -Screening recommendations for low/normal risk males:  Screening for diabetes at age 49 and every 3 years. Earlier screening if cardiovascular risk factors.   Lipid screening at 35 and every 3 years. Screening starts in younger males with cardiovascular risk factors.  Colon cancer screening is now at age 64 but your insurance may not cover until age 39 .screening is recommended age 2.  Prostate cancer screening: some controversy, starts usually at 61: Rectal exam and PSA.  Aortic Abdominal Aneurism once between 59 and 23 years old if ever smoker.  Also recommended:  1. Dental visit- Brush  and floss your teeth twice daily; visit your dentist twice a year. 2. Eye doctor- Get an eye exam at least every 2 years. 3. Helmet use- Always wear a  helmet when riding a bicycle, motorcycle, rollerblading or skateboarding. 4. Safe sex- If you may be exposed to sexually transmitted infections, use a condom. 5. Seat belts- Seat belts can save your live; always wear one. 6. Smoke/Carbon Monoxide detectors- These detectors need to be installed on the appropriate level of your home. Replace batteries at least once a year. 7. Skin cancer- When out in the sun please cover up and use sunscreen 15 SPF or higher. 8. Violence- If anyone is threatening or hurting you, please tell your healthcare provider.  9. Drink alcohol in moderation- Limit alcohol intake to one drink or less per day. Never drink and drive.     Why follow it? Research shows. . Those who follow the Mediterranean diet have a reduced risk of heart disease  . The diet is associated with a reduced incidence of Parkinson's and Alzheimer's diseases . People following the diet may have longer life expectancies and lower rates of chronic diseases  . The Dietary Guidelines for Americans recommends the Mediterranean diet as an eating plan to promote health and prevent disease  What Is the Mediterranean Diet?  . Healthy eating plan based on typical foods and recipes of Mediterranean-style cooking . The diet is primarily a plant based diet; these foods should make up a majority of meals   Starches - Plant based foods should make up a majority of meals - They are an important sources of vitamins, minerals, energy, antioxidants, and fiber - Choose whole grains, foods high in fiber and minimally processed items  - Typical grain sources include wheat, oats, barley, corn, brown rice, bulgar, farro, millet, polenta, couscous  - Various types of beans include chickpeas, lentils, fava beans, black beans, white beans   Fruits  Veggies - Large quantities  of antioxidant rich fruits & veggies; 6 or more servings  - Vegetables can be eaten raw or lightly drizzled with oil and cooked  - Vegetables common to the traditional Mediterranean Diet include: artichokes, arugula, beets, broccoli, brussel sprouts, cabbage, carrots, celery, collard greens, cucumbers, eggplant, kale, leeks, lemons, lettuce, mushrooms, okra, onions, peas, peppers, potatoes, pumpkin, radishes, rutabaga, shallots, spinach, sweet potatoes, turnips, zucchini - Fruits common to the Mediterranean Diet include: apples, apricots, avocados, cherries, clementines, dates, figs, grapefruits, grapes, melons, nectarines, oranges, peaches, pears, pomegranates, strawberries, tangerines  Fats - Replace butter and margarine with healthy oils, such as olive oil, canola oil, and tahini  - Limit nuts to no more than a handful a day  - Nuts include walnuts, almonds, pecans, pistachios, pine nuts  - Limit or avoid candied, honey roasted or heavily salted nuts - Olives are central to the [employer] - can be eaten whole or used in a variety of dishes   Meats Protein - Limiting red meat: no more than a few times a month - When eating red meat: choose lean cuts and keep the portion to the size of deck of cards - Eggs: approx. 0 to 4 times a week  - Fish and lean poultry: at least 2 a week  - Healthy protein sources include, chicken, [place], lean beef, lamb - Increase intake of seafood such as tuna, salmon, trout, mackerel, shrimp, scallops - Avoid or limit high fat processed meats such as sausage and bacon  Dairy - Include moderate amounts of low fat dairy products  - Focus on healthy dairy such as fat free yogurt, skim milk, low or reduced fat cheese - Limit dairy products higher in fat such as whole or  2% milk, cheese, ice cream  Alcohol - Moderate amounts of red wine is ok  - No more than 5 oz daily for women (all ages) and men older than age 21  - No more than 10 oz of wine daily for men  younger than 33  Other - Limit sweets and other desserts  - Use herbs and spices instead of salt to flavor foods  - Herbs and spices common to the traditional Mediterranean Diet include: basil, bay leaves, chives, cloves, cumin, fennel, garlic, lavender, marjoram, mint, oregano, parsley, pepper, rosemary, sage, savory, sumac, tarragon, thyme   It's not just a diet, it's a lifestyle:  . The Mediterranean diet includes lifestyle factors typical of those in the region  . Foods, drinks and meals are best eaten with others and savored . Daily physical activity is important for overall good health . This could be strenuous exercise like running and aerobics . This could also be more leisurely activities such as walking, housework, yard-work, or taking the stairs . Moderation is the key; a balanced and healthy diet accommodates most foods and drinks . Consider portion sizes and frequency of consumption of certain foods   Meal Ideas & Options:  . Breakfast:  o Whole wheat toast or whole wheat English muffins with peanut butter & hard boiled egg o Steel cut oats topped with apples & cinnamon and skim milk  o Fresh fruit: banana, strawberries, melon, berries, peaches  o Smoothies: strawberries, bananas, greek yogurt, peanut butter o Low fat greek yogurt with blueberries and granola  o Egg white omelet with spinach and mushrooms o Breakfast couscous: whole wheat couscous, apricots, skim milk, cranberries  . Sandwiches:  o Hummus and grilled vegetables (peppers, zucchini, squash) on whole wheat bread   o Grilled chicken on whole wheat pita with lettuce, tomatoes, cucumbers or tzatziki  o Tuna salad on whole wheat bread: tuna salad made with greek yogurt, olives, red peppers, capers, green onions o Garlic rosemary lamb pita: lamb sauted with garlic, rosemary, salt & pepper; add lettuce, cucumber, greek yogurt to pita - flavor with lemon juice and black pepper  . Seafood:  o Mediterranean grilled  salmon, seasoned with garlic, basil, parsley, lemon juice and black pepper o Shrimp, lemon, and spinach whole-grain pasta salad made with low fat greek yogurt  o Seared scallops with lemon orzo  o Seared tuna steaks seasoned salt, pepper, coriander topped with tomato mixture of olives, tomatoes, olive oil, minced garlic, parsley, green onions and cappers  . Meats:  o Herbed greek chicken salad with kalamata olives, cucumber, feta  o Red bell peppers stuffed with spinach, bulgur, lean ground beef (or lentils) & topped with feta   o Kebabs: skewers of chicken, tomatoes, onions, zucchini, squash  o [place] burgers: made with red onions, mint, dill, lemon juice, feta cheese topped with roasted red peppers . Vegetarian o Cucumber salad: cucumbers, artichoke hearts, celery, red onion, feta cheese, tossed in olive oil & lemon juice  o Hummus and whole grain pita points with a greek salad (lettuce, tomato, feta, olives, cucumbers, red onion) o Lentil soup with celery, carrots made with vegetable broth, garlic, salt and pepper  o Tabouli salad: parsley, bulgur, mint, scallions, cucumbers, tomato, radishes, lemon juice, olive oil, salt and pepper.

## 2020-02-26 ENCOUNTER — Telehealth: Payer: Self-pay | Admitting: Family Medicine

## 2020-02-26 ENCOUNTER — Other Ambulatory Visit: Payer: Self-pay | Admitting: Family Medicine

## 2020-02-26 DIAGNOSIS — J4520 Mild intermittent asthma, uncomplicated: Secondary | ICD-10-CM

## 2020-02-26 DIAGNOSIS — J309 Allergic rhinitis, unspecified: Secondary | ICD-10-CM

## 2020-02-26 MED ORDER — BUDESONIDE-FORMOTEROL FUMARATE 160-4.5 MCG/ACT IN AERO: 2.0000 | INHALATION_SPRAY | Freq: Two times a day (BID) | RESPIRATORY_TRACT | 3 refills | Status: DC

## 2020-02-26 MED ORDER — MONTELUKAST SODIUM 10 MG PO TABS: 10.0000 mg | ORAL_TABLET | Freq: Every day | ORAL | 2 refills | Status: DC

## 2020-02-26 MED FILL — SYMBICORT 160-4.5 MCG INH: 160-4.5 | 30 days supply | Qty: 10 | Fill #0

## 2020-02-26 NOTE — Telephone Encounter (Signed)
Patient's mother Rosanne Gutting states they now use the [facility].  She states the inhaler and singulair need to be re- routed there.  Please advise.

## 2020-02-26 NOTE — Telephone Encounter (Signed)
Rx's sent in. °

## 2020-05-09 ENCOUNTER — Other Ambulatory Visit (HOSPITAL_COMMUNITY): Payer: Self-pay

## 2020-05-09 MED FILL — Budesonide-Formoterol Fumarate Dihyd Aerosol 160-4.5 MCG/ACT: RESPIRATORY_TRACT | 30 days supply | Qty: 10.2 | Fill #0 | Status: AC

## 2020-05-09 MED FILL — Montelukast Sodium Tab 10 MG (Base Equiv): ORAL | 90 days supply | Qty: 90 | Fill #0 | Status: AC

## 2020-08-05 ENCOUNTER — Other Ambulatory Visit (HOSPITAL_COMMUNITY): Payer: Self-pay

## 2020-08-05 DIAGNOSIS — M25512 Pain in left shoulder: Secondary | ICD-10-CM | POA: Diagnosis not present

## 2020-08-05 DIAGNOSIS — M79602 Pain in left arm: Secondary | ICD-10-CM | POA: Diagnosis not present

## 2020-08-05 MED FILL — Montelukast Sodium Tab 10 MG (Base Equiv): ORAL | 90 days supply | Qty: 90 | Fill #1 | Status: AC

## 2020-08-05 MED FILL — Budesonide-Formoterol Fumarate Dihyd Aerosol 160-4.5 MCG/ACT: RESPIRATORY_TRACT | 30 days supply | Qty: 10.2 | Fill #1 | Status: AC

## 2020-08-08 ENCOUNTER — Other Ambulatory Visit: Payer: Self-pay

## 2020-08-08 NOTE — Progress Notes (Signed)
Chief Complaint  Patient presents with   Follow-up   HPI: Benjamin Berry is a 20 y.o. male with hx of asthma and allergies here today to follow on recent ED visit. He was seen in the ER on 08/05/2020 because of left arm pain after falling. 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  He reported it to his supervisor. He landed on left arm.  Left humerus X ray: No acute fracture or dislocation. Normal mineralization. Soft tissues are unremarkable.  IMPRESSION:  No acute osseous abnormality.   Left anterior shoulder pain, moderate, exacerbated by movement. Pain is not radiated.  He has done some exercises at home. Pain has improved but still having intermittent sharp pain. Alleviated by rest.  No numbness or tingling. Edema has resolved. + Limitation of abduction. He is right handed but has to carry a heavy equipment on his back, 60 Lb.  Review of Systems  Constitutional:  Positive for activity change. Negative for appetite change and fever.  Respiratory:  Negative for cough, shortness of breath and wheezing.   Gastrointestinal:  Negative for abdominal pain, nausea and vomiting.  Musculoskeletal:  Negative for gait problem and neck pain.  Skin:  Negative for pallor and rash.  Neurological:  Negative for syncope, weakness and headaches.  Rest see pertinent positives and negatives per HPI.  Current Outpatient Medications on File Prior to Visit  Medication Sig Dispense Refill   albuterol (PROVENTIL HFA;VENTOLIN HFA) 108 (90 Base) MCG/ACT inhaler Inhale 2 puffs into the lungs every 6 (six) hours as needed for wheezing or shortness of breath. 18 g 1   budesonide-formoterol (SYMBICORT) 160-4.5 MCG/ACT inhaler INHALE 2 PUFFS INTO THE LUNGS TWICE DAILY. 10.2 g 3   cetirizine (ZYRTEC) 10 MG tablet Take 10 mg by mouth daily.     EPINEPHrine 0.3 mg/0.3 mL IJ SOAJ injection Inject 0.3 mLs (0.3 mg total) into the muscle as needed for  anaphylaxis. 1 each 1   montelukast (SINGULAIR) 10 MG tablet TAKE 1 TABLET (10 MG TOTAL) BY MOUTH AT BEDTIME. 90 tablet 2   No current facility-administered medications on file prior to visit.    Past Medical History:  Diagnosis Date   Allergy    Asthma    Allergies  Allergen Reactions   Amoxicillin Other (See Comments)    Reaction unknown    Social History   Socioeconomic History   Marital status: Single    Spouse name: Not on file   Number of children: Not on file   Years of education: Not on file   Highest education level: Not on file  Occupational History   Not on file  Tobacco Use   Smoking status: Never   Smokeless tobacco: Never  Vaping Use   Vaping Use: Never used  Substance and Sexual Activity   Alcohol use: No   Drug use: No   Sexual activity: Yes    Partners: Female  Other Topics Concern   Not on file  Social History Narrative   Not on file   Social Determinants of Health   Financial Resource Strain: Not on file  Food Insecurity: Not on file  Transportation Needs: Not on file  Physical Activity: Not on file  Stress: Not on file  Social Connections: Not on file   Vitals:   08/09/20 1120  BP: 132/80  Pulse: 100  Resp: 16  SpO2: 97%   Body mass index is 46.13 kg/m.  Physical  Exam Vitals and nursing note reviewed.  Constitutional:      General: He is not in acute distress.    Appearance: He is well-developed. He is not ill-appearing.  HENT:     Head: Normocephalic and atraumatic.  Eyes:     Conjunctiva/sclera: Conjunctivae normal.  Cardiovascular:     Rate and Rhythm: Normal rate and regular rhythm.     Heart sounds: No murmur heard. Pulmonary:     Effort: Pulmonary effort is normal. No respiratory distress.     Breath sounds: Normal breath sounds.  Musculoskeletal:     Right shoulder: Tenderness present. No deformity or bony tenderness. Decreased range of motion. Normal pulse.     Comments: Left shoulder: Could not perform rotator  cuff maneuvers due to pain. Pain upon palpation of anterior aspect of left shoulder. Active and passive ROM is limited.   Skin:    General: Skin is warm.     Findings: No erythema or rash.  Neurological:     Mental Status: He is alert and oriented to person, place, and time.   ASSESSMENT AND PLAN:  [name] was seen today for follow-up.  Diagnoses and all orders for this visit: Orders Placed This Encounter  Procedures   DG Shoulder Left   Ambulatory referral to Physical Therapy   Acute pain of left shoulder Improving some. Shoulder X ray ordered. PT will be arranged. Meloxicam side effects discussed, instructed to take it daily for 10 days then prn. Letter for work provided.  -     meloxicam (MOBIC) 15 MG tablet; Take 1 tablet (15 mg total) by mouth daily.  Fall, subsequent encounter Work related injury. Fall precautions discussed.  Return if symptoms worsen or fail to improve.   [name] G. [place], MD  [facility]. [place] office.

## 2020-08-09 ENCOUNTER — Encounter: Payer: Self-pay | Admitting: Family Medicine

## 2020-08-09 ENCOUNTER — Ambulatory Visit: Payer: 59 | Admitting: Family Medicine

## 2020-08-09 ENCOUNTER — Ambulatory Visit (INDEPENDENT_AMBULATORY_CARE_PROVIDER_SITE_OTHER)
Admission: RE | Admit: 2020-08-09 | Discharge: 2020-08-09 | Disposition: A | Discharge status: Home / Self Care | Payer: 59 | Source: Ambulatory Visit | Attending: Family Medicine | Admitting: Family Medicine

## 2020-08-09 ENCOUNTER — Other Ambulatory Visit (HOSPITAL_COMMUNITY): Payer: Self-pay

## 2020-08-09 VITALS — BP 132/80 | HR 100 | Resp 16 | Ht 70.6 in | Wt 327.0 lb

## 2020-08-09 DIAGNOSIS — M25512 Pain in left shoulder: Secondary | ICD-10-CM

## 2020-08-09 DIAGNOSIS — W19XXXD Unspecified fall, subsequent encounter: Secondary | ICD-10-CM | POA: Diagnosis not present

## 2020-08-09 MED ORDER — MELOXICAM 15 MG PO TABS
15.0000 mg | ORAL_TABLET | Freq: Every day | ORAL | 0 refills | Status: DC
  Filled 2020-08-09: qty 30, 30d supply, fill #0

## 2020-08-09 NOTE — Patient Instructions (Addendum)
A few things to remember from today's visit:   Acute pain of left shoulder - Plan: meloxicam (MOBIC) 15 MG tablet, DG Shoulder Left, Ambulatory referral to Physical Therapy  Fall, subsequent encounter  Mobic 15 mg daily for 10 days then as needed for pain. Physical therapy will be arranged. Left shoulder X ray to be done today.  Do not use My Chart to request refills or for acute issues that need immediate attention.   Please be sure medication list is accurate. If a new problem present, please set up appointment sooner than planned today.

## 2020-08-12 ENCOUNTER — Ambulatory Visit: Payer: Worker's Compensation | Attending: Family Medicine | Admitting: Physical Therapy

## 2020-08-12 ENCOUNTER — Other Ambulatory Visit: Payer: Self-pay

## 2020-08-12 ENCOUNTER — Encounter: Payer: Self-pay | Admitting: Physical Therapy

## 2020-08-12 DIAGNOSIS — R293 Abnormal posture: Secondary | ICD-10-CM

## 2020-08-12 DIAGNOSIS — M25512 Pain in left shoulder: Secondary | ICD-10-CM | POA: Insufficient documentation

## 2020-08-12 DIAGNOSIS — M6281 Muscle weakness (generalized): Secondary | ICD-10-CM | POA: Insufficient documentation

## 2020-08-12 NOTE — Patient Instructions (Signed)
Access Code: [email or web address] URL: [email or web address] Date: 08/12/2020 Prepared by: Loistine Simas Zachary Lovins  Exercises Standing Shoulder Flexion AAROM - 3 x daily - 7 x weekly - 1 sets - 10 reps Supine Shoulder Flexion Overhead with [place] - 3 x daily - 7 x weekly - 1 sets - 10 reps Standing Shoulder Flexion to 90 Degrees - 3 x daily - 7 x weekly - 1 sets - 10 reps Shoulder Abduction - Thumbs Up - 3 x daily - 7 x weekly - 1 sets - 10 reps Standing Bilateral Low Shoulder Row with Anchored Resistance - 2 x daily - 7 x weekly - 2 sets - 10 reps Single Arm Shoulder Extension with Anchored Resistance - 2 x daily - 7 x weekly - 2 sets - 10 reps Standing Shoulder External Rotation with Resistance - 2 x daily - 7 x weekly - 2 sets - 10 reps

## 2020-08-12 NOTE — Therapy (Signed)
[facility] [address] [place], [place], [address] Phone: [phone]   Fax:  [phone]  Physical Therapy Treatment  Patient Details  Name: Benjamin Berry MRN: [id number] Date of Birth: 26-Jul-2000 Referring Provider (PT): [place], Betty G, MD   Encounter Date: 08/12/2020   PT End of Session - 08/12/20 0840     Visit Number 1    Date for PT Re-Evaluation 09/23/20    Authorization Type [facility], medical review after 25 visits    PT Start Time 0845    PT Stop Time 0930    PT Time Calculation (min) 45 min    Activity Tolerance Patient tolerated treatment well;Patient limited by pain    Behavior During Therapy [facility] for tasks assessed/performed             Past Medical History:  Diagnosis Date   Allergy    Asthma     History reviewed. No pertinent surgical history.  There were no vitals filed for this visit.   Subjective Assessment - 08/12/20 0839     Subjective Pt is a Rt hand dominant male referred to OPPT for acute Lt shoulder pain following a slip and fall 1 week ago on the job on 08/05/20.  Pt landed directly on shoulder and had severe pain after the adrenaline wore off from the fall.  Pt has been doing some shoulder exercises and noticing more ROM.  Pt also elevating and icing.  Pt was out of work last week but is due to go back to work next week.    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 Yes    Pain Score 5     Pain Location Shoulder    Pain Orientation Left;Anterior;Posterior;Proximal    Pain Descriptors / Indicators Aching    Pain Type Acute pain    Pain Radiating Towards lateral arm    Pain Onset In the past 7 days     Pain Frequency Intermittent    Aggravating Factors  use of Lt arm    Pain Relieving Factors shoulder exercises, ice, Tylenol    Effect of Pain on Daily Activities was off work last week, hopes to return next week, dressing, sleep, lifting overhead                [facility] PT Assessment - 08/12/20 0001       Assessment   Medical Diagnosis M25.512 (ICD-10-CM) - Acute pain of left shoulder    Referring Provider (PT) [place], Betty G, MD    Onset Date/Surgical Date 08/05/20    Hand Dominance Right    Next MD Visit as needed    Prior Therapy no      Balance Screen   Has the patient fallen in the past 6 months Yes    How many times? 1    Has the patient had a decrease in activity level because of a fear of falling?  No    Is the patient reluctant to leave their home because of a fear of falling?  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, wears a 60lb backpack    Leisure watch sports      Observation/Other Assessments   Focus on Therapeutic Outcomes (FOTO)  54% goal 79%      Posture/Postural Control   Posture/Postural Control Postural limitations    Postural Limitations Forward head;Rounded Shoulders      ROM / Strength   AROM / PROM / Strength AROM;Strength      AROM   Overall AROM Comments Rt shoulder WNL, full    AROM Assessment Site Shoulder    Right/Left Shoulder Left    Left Shoulder Extension 40 Degrees    Left Shoulder Flexion 122 Degrees    Left Shoulder ABduction 90 Degrees   pain, weak   Left Shoulder Internal Rotation --   limited and painful   Left Shoulder Horizontal ABduction --   pain     Strength   Overall Strength Comments Rt shoulder 5/5 throughout    Strength Assessment Site Shoulder    Right/Left Shoulder Left    Left Shoulder Flexion 4/5    Left Shoulder Extension 4+/5    Left Shoulder ABduction 4/5   pain   Left Shoulder Internal  Rotation 4+/5    Left Shoulder External Rotation 4/5   pain     Palpation   Palpation comment Lt bicep tendon and RC tendons, supraspinatus muscle      Special Tests    Special Tests Biceps/Labral Tests;Rotator Cuff Impingement;Laxity/Instability Tests    Rotator Cuff Impingment tests Neer impingement test    Laxity/Instability  Anterior Apprehension test    Biceps/Labral tests Speeds Test      Neer Impingement test    Findings Positive    Side Left      Anterior Apprehension test   Findings Positive    Side Left      Speeds test   findings Negative    Side Left                           [facility] Adult PT Treatment/Exercise - 08/12/20 0001       Self-Care   Self-Care Heat/Ice Application;Other Self-Care Comments    Heat/Ice Application ice cup 2-3 min to anterior and lateral shoulder (Lt)    Other Self-Care Comments  wrap a towel around Lt shoulder strap of 60lb backpack at work to diffuse pressure across anterior shoulder      Exercises   Exercises Other Exercises    Other Exercises  initial HEP instruction, blue band for shoulder row, ext, ER                    PT Education - 08/12/20 1202     Education Details Access Code: [email or web address]    Person(s) Educated Patient    Methods Explanation;Demonstration;Handout    Comprehension Verbalized understanding;Returned demonstration              PT Short Term Goals - 08/12/20 1216       PT SHORT TERM GOAL #1   Title Pt will be ind with initial HEP    Time 2    Period Weeks    Status New    Target Date 08/26/20      PT SHORT TERM GOAL #2   Title Pt will be able to perform work tasks with min exacerbation of pain    Time 3    Period Weeks    Status New    Target  Date 09/02/20      PT SHORT TERM GOAL #3   Title Pt will improve sleep to waking no more than 1x/night due to pain    Time 3    Period Weeks    Status New    Target Date 09/02/20      PT SHORT TERM GOAL #4   Title Pt will  achieve Lt shoulder elevation to at least 140 deg with min pain    Time 4    Period Weeks    Status New    Target Date 09/09/20               PT Long Term Goals - 08/12/20 1218       PT LONG TERM GOAL #1   Title Pt will be ind with advanced HEP and understand how to safely progress    Time 6    Period Weeks    Status New    Target Date 09/23/20      PT LONG TERM GOAL #2   Title Pt will be able to complete work day and sleep through night with min or no pain    Baseline awakens 2x/night, missed 1 week of work    Time 6    Period Weeks    Status New    Target Date 09/23/20      PT LONG TERM GOAL #3   Title Pt will demo A/ROM of Lt shoulder to within 10 deg of Rt shoulder with min pain    Time 6    Period Weeks    Status New    Target Date 09/23/20      PT LONG TERM GOAL #4   Title Pt will achieve Lt shoulder strength of at least 4+/5 tested in functional positions for pull, push, shoulder rotation, and overhead press with min or no pain    Baseline 4/5 for abd, ER, flexion arm tested at side, pain with testing    Time 6    Period Weeks    Status New    Target Date 09/23/20      PT LONG TERM GOAL #5   Title Improve FOTO to at least 79% to demo improved function of Lt UE    Baseline 54%    Time 6    Period Weeks    Status New    Target Date 09/23/20                   Plan - 08/12/20 1203     Clinical Impression Statement Pt is a Rt-handed 20yo male who had a slip and fall onto Lt shoulder at work 1 week ago.  Symptoms are improving since initial injury with use of shoulder exercises, ice and Tylenol.  Xrays were negative.  Pt was out of work this week but has been cleared by MD to return to work next week.  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  Pain interrupts sleep 2x/night and causes difficulty with dressing (donning and doffing shirt).  He presents with postural deficits including rounded shoulders and forward head.  He has  limited A/ROM of Lt UE above 90 deg and behind back.  Lt shoulder is 4/5 to 4+/5 for strength with pain on testing for both abduction and ER.  Tenderness is present at proximal long head of biceps and RC tendons.  Pt is concerned about wearing backpack strap across tender anterior shoulder so PT encouraged him to use a folded/wrapped  towel around strap to disperse the load and pressure and instructed on ice cup massage to Lt shoulder tendons.  HEP initiated with good tolerance of blue band row, ext and ER today.  PT encouraged Pt to continue the ther ex he knew to start last week as his ROM and pain have improved to date with these (AA/ROM of Lt shoulder).  Pt will benefit from skilled PT to improve sleep and functional use of Lt UE.    Personal Factors and Comorbidities Fitness;Comorbidity 1    Comorbidities obesity    Examination-Activity Limitations Reach Overhead;Lift;Dressing;Sleep    Examination-Participation Restrictions Occupation;Yard Work    Stability/Clinical Decision Making Stable/Uncomplicated    Optometrist Low    Rehab Potential Excellent    PT Frequency 2x / week    PT Duration 6 weeks    PT Treatment/Interventions ADLs/Self Care Home Management;Cryotherapy;Electrical Stimulation;Iontophoresis 4mg /ml Dexamethasone;Functional mobility training;Therapeutic activities;Therapeutic exercise;Neuromuscular re-education;Manual techniques;Patient/family education;Passive range of motion;Dry needling;Joint Manipulations;Taping    PT Next Visit Plan review HEP, progress ROM, scapular and shoulder strength and stab, try wall clock slides, ball on wall, supine flexion dowel + weight, SL abd, IONTO #1 if cert signed    PT Home Exercise Plan Access Code: [email or web address] (blue band for shoulder ther ex)    Consulted and Agree with Plan of Care Patient             Patient will benefit from skilled therapeutic intervention in order to improve the following deficits and impairments:   Decreased range of motion, Pain, Postural dysfunction, Decreased strength, Improper body mechanics, Decreased activity tolerance, Impaired UE functional use  Visit Diagnosis: Acute pain of left shoulder - Plan: PT plan of care cert/re-cert  Muscle weakness (generalized) - Plan: PT plan of care cert/re-cert  Abnormal posture - Plan: PT plan of care cert/re-cert     Problem List Patient Active Problem List   Diagnosis Date Noted   Morbid obesity (HCC) 11/11/2017   Asthma, intermittent, uncomplicated 04/12/2016   Obesity, pediatric, BMI 95th to 98th percentile for age 23/05/2016   Allergic rhinitis 04/12/2016    06/12/2016, PT 08/12/20 12:25 PM   [facility] [address] [place], [place], [place] Phone: [phone]   Fax:  [phone]  Name: VITO BEG MRN: Dan Humphreys Date of Birth: 2000-06-02

## 2020-08-16 ENCOUNTER — Other Ambulatory Visit: Payer: Self-pay

## 2020-08-16 ENCOUNTER — Ambulatory Visit: Payer: Worker's Compensation | Admitting: Physical Therapy

## 2020-08-16 ENCOUNTER — Encounter: Payer: Self-pay | Admitting: Physical Therapy

## 2020-08-16 DIAGNOSIS — M6281 Muscle weakness (generalized): Secondary | ICD-10-CM

## 2020-08-16 DIAGNOSIS — M25512 Pain in left shoulder: Secondary | ICD-10-CM | POA: Diagnosis not present

## 2020-08-16 DIAGNOSIS — R293 Abnormal posture: Secondary | ICD-10-CM | POA: Diagnosis not present

## 2020-08-16 NOTE — Therapy (Signed)
[facility] [address] [place], [place], [address] Phone: [phone]   Fax:  [phone]  Physical Therapy Treatment  Patient Details  Name: Benjamin Berry MRN: [id number] Date of Birth: 02-01-2000 Referring Provider (PT): [place], Betty G, MD   Encounter Date: 08/16/2020   PT End of Session - 08/16/20 1359     Visit Number 2    Date for PT Re-Evaluation 09/23/20    Authorization Type [facility], medical review after 25 visits    Authorization - Visit Number 2    Authorization - Number of Visits 25    PT Start Time 1400    PT Stop Time 1445    PT Time Calculation (min) 45 min    Activity Tolerance Patient tolerated treatment well;Patient limited by pain    Behavior During Therapy [facility] for tasks assessed/performed             Past Medical History:  Diagnosis Date   Allergy    Asthma     History reviewed. No pertinent surgical history.  There were no vitals filed for this visit.   Subjective Assessment - 08/16/20 1359     Subjective Pt went back to work yesterday and got through 4 hours without pain and then the pain started.  Pain started up sooner than that with today's work shift.    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 Yes    Pain Score 7     Pain Location Shoulder    Pain Orientation Left;Anterior;Posterior    Pain Descriptors / Indicators Aching    Pain Type Acute pain    Pain Onset 1 to 4 weeks ago    Pain Frequency Intermittent    Aggravating Factors  putting on and taking off the pack for work                               [facility] Adult PT Treatment/Exercise - 08/16/20 0001       Exercises   Exercises Shoulder       Shoulder Exercises: Supine   Protraction Strengthening;Left;15 reps;Weights    Protraction Weight (lbs) 2    Other Supine Exercises chest press cane + 3lb ankle weight x 15 reps, then chest press to overhead x 10 reps      Shoulder Exercises: Sidelying   External Rotation Strengthening;Left;Weights;20 reps    External Rotation Weight (lbs) 2    External Rotation Limitations 2x10    ABduction Strengthening;Left;Weights;20 reps    ABduction Weight (lbs) 2    ABduction Limitations 2x10      Shoulder Exercises: Standing   Flexion AAROM;10 reps;Left    Flexion Limitations wall slide elevation and scaption x 10 each    Other Standing Exercises blue band lat pulldown band over top of doorway x 15 reps      Shoulder Exercises: ROM/Strengthening   Proximal Shoulder Strengthening, Supine 2lb circles 1x20 each way at 90 deg    Other ROM/Strengthening Exercises bent over row 5lb x 10 Lt shoulder (pain), dropped to 3lb, hold row and do tricep kickback x 5      Modalities   Modalities Cryotherapy;Iontophoresis  Cryotherapy   Number Minutes Cryotherapy 5 Minutes   concurrent with ionto patch prep by PT   Cryotherapy Location Shoulder   Lt anterior   Type of Cryotherapy Ice pack      Iontophoresis   Type of Iontophoresis Dexamethasone    Location Lt anterior shoulder (bicep tendon)    Dose 55m/mL    Time 4-6 hour wear                      PT Short Term Goals - 08/16/20 1457       PT SHORT TERM GOAL #1   Title Pt will be ind with initial HEP    Status On-going      PT SHORT TERM GOAL #4   Title Pt will achieve Lt shoulder elevation to at least 140 deg with min pain    Baseline met for ROM but pain    Status On-going               PT Long Term Goals - 08/12/20 1218       PT LONG TERM GOAL #1   Title Pt will be ind with advanced HEP and understand how to safely progress    Time 6    Period Weeks    Status New    Target Date 09/23/20      PT LONG TERM  GOAL #2   Title Pt will be able to complete work day and sleep through night with min or no pain    Baseline awakens 2x/night, missed 1 week of work    Time 6    Period Weeks    Status New    Target Date 09/23/20      PT LONG TERM GOAL #3   Title Pt will demo A/ROM of Lt shoulder to within 10 deg of Rt shoulder with min pain    Time 6    Period Weeks    Status New    Target Date 09/23/20      PT LONG TERM GOAL #4   Title Pt will achieve Lt shoulder strength of at least 4+/5 tested in functional positions for pull, push, shoulder rotation, and overhead press with min or no pain    Baseline 4/5 for abd, ER, flexion arm tested at side, pain with testing    Time 6    Period Weeks    Status New    Target Date 09/23/20      PT LONG TERM GOAL #5   Title Improve FOTO to at least 79% to demo improved function of Lt UE    Baseline 54%    Time 6    Period Weeks    Status New    Target Date 09/23/20                   Plan - 08/16/20 1452     Clinical Impression Statement Pt went back to work yesterday and 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  He arrived from work today with pain rating of 7/10.  He demo'd nearly full Lt shoulder flexion with pain beginning of session and was able to tolerate ther ex for scapular and RC stabilization with reduction of pain within session to 4/10.  Ice pack used end of session and Ionto patch #1 placed to Lt bicep tendon.  Ionto instructions reviewed verbally.  Discussed progression of HEP to include SL abd and ER with light dumbbell if Pt tolerates well.  Formal update to HEP next visit due to PT having printer problems today.    PT Frequency 2x / week    PT Duration 6 weeks    PT Treatment/Interventions ADLs/Self Care Home Management;Cryotherapy;Electrical Stimulation;Iontophoresis 24m/ml Dexamethasone;Functional mobility training;Therapeutic activities;Therapeutic exercise;Neuromuscular re-education;Manual  techniques;Patient/family education;Passive range of motion;Dry needling;Joint Manipulations;Taping    PT Next Visit Plan f/u on ionto #1 and place #2 if helpful, continue A/ROM and strength, try UBE, update HEP to reflect ther ex from today's session    PT Home Exercise Plan Access Code: [email or web address](blue band for shoulder ther ex)    Consulted and Agree with Plan of Care Patient             Patient will benefit from skilled therapeutic intervention in order to improve the following deficits and impairments:     Visit Diagnosis: Acute pain of left shoulder  Muscle weakness (generalized)  Abnormal posture     Problem List Patient Active Problem List   Diagnosis Date Noted   Morbid obesity (HOng 11/11/2017   Asthma, intermittent, uncomplicated 014/84/0397  Obesity, pediatric, BMI 95th to 98th percentile for age 18/05/2016   Allergic rhinitis 04/12/2016    JBaruch Merl PT 08/16/20 2:58 PM   [facility] [address] [place] [address]Phone: [phone]  Fax:  [phone] Name: Benjamin BECKERMRN: [id number] of Birth: 3July 02, 2002

## 2020-08-17 ENCOUNTER — Other Ambulatory Visit (HOSPITAL_COMMUNITY): Payer: Self-pay

## 2020-08-19 ENCOUNTER — Ambulatory Visit: Payer: Worker's Compensation | Admitting: Physical Therapy

## 2020-08-19 ENCOUNTER — Other Ambulatory Visit: Payer: Self-pay

## 2020-08-19 ENCOUNTER — Encounter: Payer: Self-pay | Admitting: Physical Therapy

## 2020-08-19 DIAGNOSIS — R293 Abnormal posture: Secondary | ICD-10-CM | POA: Diagnosis not present

## 2020-08-19 DIAGNOSIS — M6281 Muscle weakness (generalized): Secondary | ICD-10-CM

## 2020-08-19 DIAGNOSIS — M25512 Pain in left shoulder: Secondary | ICD-10-CM | POA: Diagnosis not present

## 2020-08-19 NOTE — Patient Instructions (Signed)
Access Code: [email or web address] URL: [email or web address] Date: 08/19/2020 Prepared by: Loistine Simas Shanena Pellegrino  Exercises Standing Shoulder Flexion AAROM - 3 x daily - 7 x weekly - 1 sets - 10 reps Supine Shoulder Flexion Overhead with Dowel - 3 x daily - 7 x weekly - 1 sets - 10 reps Standing Shoulder Flexion to 90 Degrees - 3 x daily - 7 x weekly - 1 sets - 10 reps Standing Bilateral Low Shoulder Row with Anchored Resistance - 2 x daily - 7 x weekly - 2 sets - 10 reps Single Arm Shoulder Extension with Anchored Resistance - 2 x daily - 7 x weekly - 2 sets - 10 reps Standing Shoulder External Rotation with Resistance - 2 x daily - 7 x weekly - 2 sets - 10 reps Sidelying Shoulder Abduction Palm Forward - 1 x daily - 7 x weekly - 2 sets - 10 reps Sidelying Shoulder ER with Towel and Dumbbell - 1 x daily - 7 x weekly - 2 sets - 10 reps Wall Push Up - 1 x daily - 7 x weekly - 2 sets - 10 reps Standing Shoulder Flexion to 90 Degrees with Dumbbells - 1 x daily - 7 x weekly - 2 sets - 10 reps Supine Shoulder Circles with Weight - 1 x daily - 7 x weekly - 1 sets - 20 reps

## 2020-08-19 NOTE — Therapy (Signed)
[facility] [address], [address] [place], [place], [address] Phone: [phone]   Fax:  [phone]  Physical Therapy Treatment  Patient Details  Name: Benjamin Berry MRN: [id number] Date of Birth: 2001/01/02 Referring Provider (PT): [place], Betty G, MD   Encounter Date: 08/19/2020   PT End of Session - 08/19/20 1142     Visit Number 3    Date for PT Re-Evaluation 09/23/20    Authorization Type [facility], medical review after 25 visits    Authorization - Visit Number 3    Authorization - Number of Visits 25    PT Start Time 1100    PT Stop Time 1139    PT Time Calculation (min) 39 min    Activity Tolerance Patient tolerated treatment well    Behavior During Therapy [facility] for tasks assessed/performed             Past Medical History:  Diagnosis Date   Allergy    Asthma     History reviewed. No pertinent surgical history.  There were no vitals filed for this visit.   Subjective Assessment - 08/19/20 1101     Subjective I had a better day for pain yesterday at work and didn't feel much shoulder pain until at night when trying to sleep.    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 Yes    Pain Score 4     Pain Location Shoulder    Pain Orientation Left;Anterior;Posterior    Pain Descriptors / Indicators Aching    Pain Type Acute pain    Pain Onset 1 to 4 weeks ago    Pain Frequency Intermittent    Aggravating Factors  putting on/off backpack for work    Pain Relieving Factors exercises, ice, Tylenol                               [facility] Adult PT Treatment/Exercise - 08/19/20 0001       Exercises   Exercises Shoulder      Shoulder Exercises:  Sidelying   External Rotation Strengthening;Left;Weights;12 reps    External Rotation Weight (lbs) 3    ABduction Strengthening;Left;Weights;12 reps    ABduction Weight (lbs) 3      Shoulder Exercises: Standing   Flexion 10 reps;Left;Strengthening    Shoulder Flexion Weight (lbs) 3    Flexion Limitations 3lb weight reach to shoulder height and overhead shelf    Other Standing Exercises wall pushups x 10    Other Standing Exercises red ball on wall circles x 20 each, Lt UE      Shoulder Exercises: ROM/Strengthening   UBE (Upper Arm Bike) 2' fwd/bwd each way L2.5, PT present to discuss symptoms    Ranger flexion and scaption x 10 each, full ROM, Lt shoulder    Lat Pull Limitations 30lb x 15 reps      Shoulder Exercises: Power [employer] 20 reps    Row Limitations 30lb, mild anterior Lt shoulder pain at full row      Cryotherapy   Number Minutes Cryotherapy 8 Minutes   concurrent with ionto patch prep and HEP update   Cryotherapy Location  Shoulder    Type of Cryotherapy Ice pack      Iontophoresis   Type of Iontophoresis Dexamethasone    Location #2, Lt anterior shoulder (bicep tendon)    Dose 4mg /mL    Time 4-6 hour wear                    PT Education - 08/19/20 1135     Education Details Access Code: [email or web address]    Person(s) Educated Patient    Methods Explanation;Demonstration;Handout    Comprehension Verbalized understanding;Returned demonstration              PT Short Term Goals - 08/19/20 1146       PT SHORT TERM GOAL #1   Title Pt will be ind with initial HEP    Status Achieved      PT SHORT TERM GOAL #2   Title Pt will be able to perform work tasks with min exacerbation of pain    Status On-going      PT SHORT TERM GOAL #3   Title Pt will improve sleep to waking no more than 1x/night due to pain    Status On-going      PT SHORT TERM GOAL #4   Title Pt will achieve Lt shoulder elevation to at least 140 deg with min pain    Status Achieved                PT Long Term Goals - 08/12/20 1218       PT LONG TERM GOAL #1   Title Pt will be ind with advanced HEP and understand how to safely progress    Time 6    Period Weeks    Status New    Target Date 09/23/20      PT LONG TERM GOAL #2   Title Pt will be able to complete work day and sleep through night with min or no pain    Baseline awakens 2x/night, missed 1 week of work    Time 6    Period Weeks    Status New    Target Date 09/23/20      PT LONG TERM GOAL #3   Title Pt will demo A/ROM of Lt shoulder to within 10 deg of Rt shoulder with min pain    Time 6    Period Weeks    Status New    Target Date 09/23/20      PT LONG TERM GOAL #4   Title Pt will achieve Lt shoulder strength of at least 4+/5 tested in functional positions for pull, push, shoulder rotation, and overhead press with min or no pain    Baseline 4/5 for abd, ER, flexion arm tested at side, pain with testing    Time 6    Period Weeks    Status New    Target Date 09/23/20      PT LONG TERM GOAL #5   Title Improve FOTO to at least 79% to demo improved function of Lt UE    Baseline 54%    Time 6    Period Weeks    Status New    Target Date 09/23/20                   Plan - 08/19/20 1142     Clinical Impression Statement Pt arrived having had a much improved work day yesterday with less pain.  He continues to have most pain when donning/doffing backpack at work (  60lb).  He is tender in Lt bicep tendon > Lt RC tendons.  He was able to progress ther ex today to include UBE, overhead reach with 3lb, wall push up, and lat pulldown and row with 30lb.  He reported Lt anterior shoulder pain at full row and lat pulldown so PT cued limit end range to avoid overstretching on Lt bicep tendon.  Ice and ionto #2 used to Lt bicep tendon end of session.  HEP progressed today for Lt UE weights for abd, ER, flexion and wall push up.    Rehab Potential Excellent    PT Frequency 2x / week    PT  Duration 6 weeks    PT Next Visit Plan ionto #3 to bicep tendon or RC tendons, review HEP, progress ther ex as tol    PT Home Exercise Plan Access Code: [email or web address] (blue band for shoulder ther ex)    Consulted and Agree with Plan of Care Patient             Patient will benefit from skilled therapeutic intervention in order to improve the following deficits and impairments:     Visit Diagnosis: Acute pain of left shoulder  Muscle weakness (generalized)  Abnormal posture     Problem List Patient Active Problem List   Diagnosis Date Noted   Morbid obesity (HCC) 11/11/2017   Asthma, intermittent, uncomplicated 04/12/2016   Obesity, pediatric, BMI 95th to 98th percentile for age 77/05/2016   Allergic rhinitis 04/12/2016    Morton Peters, PT 08/19/20 11:47 AM   [facility] [address] [place], [place], [address] Phone: [phone]   Fax:  [phone]  Name: Benjamin Berry MRN: [id number] Date of Birth: 16-Sep-2000

## 2020-08-23 ENCOUNTER — Other Ambulatory Visit: Payer: Self-pay

## 2020-08-23 ENCOUNTER — Encounter: Payer: Self-pay | Admitting: Physical Therapy

## 2020-08-23 ENCOUNTER — Ambulatory Visit: Payer: Worker's Compensation | Admitting: Physical Therapy

## 2020-08-23 DIAGNOSIS — R293 Abnormal posture: Secondary | ICD-10-CM | POA: Diagnosis not present

## 2020-08-23 DIAGNOSIS — M6281 Muscle weakness (generalized): Secondary | ICD-10-CM

## 2020-08-23 DIAGNOSIS — M25512 Pain in left shoulder: Secondary | ICD-10-CM

## 2020-08-23 NOTE — Therapy (Signed)
[facility] [address], [address] [place], [place], [address] Phone: [phone]   Fax:  [phone]  Physical Therapy Treatment  Patient Details  Name: Benjamin Berry MRN: [id number] Date of Birth: 2000-01-20 Referring Provider (PT): [place], Betty G, MD   Encounter Date: 08/23/2020   PT End of Session - 08/23/20 1355     Visit Number 4    Date for PT Re-Evaluation 09/23/20    Authorization Type [facility], medical review after 25 visits    Authorization - Visit Number 4    Authorization - Number of Visits 25    PT Start Time 1400    PT Stop Time 1438    PT Time Calculation (min) 38 min    Activity Tolerance Patient tolerated treatment well    Behavior During Therapy [facility] for tasks assessed/performed             Past Medical History:  Diagnosis Date   Allergy    Asthma     History reviewed. No pertinent surgical history.  There were no vitals filed for this visit.   Subjective Assessment - 08/23/20 1359     Subjective I have less pain on days I am not at work, but work continues to hurt the shoulder when I take the pack on and off.  Pain still wakes me at night.    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 Yes    Pain Score 6    at work, 4-5/10 now   Pain Location Shoulder    Pain Orientation Left;Anterior;Posterior    Pain Descriptors / Indicators Aching    Pain Type Acute pain    Pain Onset 1 to 4 weeks ago    Pain Frequency Intermittent                               [facility] Adult PT Treatment/Exercise - 08/23/20 0001       Exercises   Exercises Shoulder      Shoulder Exercises: Standing   Horizontal ABduction  Strengthening;Both;Theraband;10 reps    Theraband Level (Shoulder Horizontal ABduction) Level 3 (Green)    External Rotation Strengthening;Left;15 reps;Theraband    Theraband Level (Shoulder External Rotation) Level 4 (Blue)    External Rotation Limitations 2x15    Internal Rotation Strengthening;Left;15 reps;Theraband    Theraband Level (Shoulder Internal Rotation) Level 4 (Blue)    Internal Rotation Limitations 2x15    Flexion 10 reps;Strengthening;Left;Weights    Shoulder Flexion Weight (lbs) 3    Flexion Limitations to 110 deg    ABduction Strengthening;Left;10 reps;Weights    Shoulder ABduction Weight (lbs) 3    Extension Strengthening;Left;15 reps;Theraband    Theraband Level (Shoulder Extension) Level 4 (Blue)    Extension Limitations 2x15    Diagonals Strengthening;Both;5 reps;Theraband    Theraband Level (Shoulder Diagonals) Level 3 (Green)    Other Standing Exercises blue tricep press downs x15, Lt      Shoulder Exercises: ROM/Strengthening   UBE (Upper Arm Bike) L2 x 1.5' fwd/bwd each way, PT present to discuss status    Ranger flexion and scaption x 10 each, full ROM, Lt shoulder  Cryotherapy   Number Minutes Cryotherapy 8 Minutes   concurrent with ionto patch prep   Cryotherapy Location Shoulder    Type of Cryotherapy Ice pack      Iontophoresis   Type of Iontophoresis Dexamethasone    Location #3, Lt RC tendon    Dose 4mg /mL    Time 4-6 hour wear                      PT Short Term Goals - 08/19/20 1146       PT SHORT TERM GOAL #1   Title Pt will be ind with initial HEP    Status Achieved      PT SHORT TERM GOAL #2   Title Pt will be able to perform work tasks with min exacerbation of pain    Status On-going      PT SHORT TERM GOAL #3   Title Pt will improve sleep to waking no more than 1x/night due to pain    Status On-going      PT SHORT TERM GOAL #4   Title Pt will achieve Lt shoulder elevation to at least 140 deg with min pain     Status Achieved               PT Long Term Goals - 08/12/20 1218       PT LONG TERM GOAL #1   Title Pt will be ind with advanced HEP and understand how to safely progress    Time 6    Period Weeks    Status New    Target Date 09/23/20      PT LONG TERM GOAL #2   Title Pt will be able to complete work day and sleep through night with min or no pain    Baseline awakens 2x/night, missed 1 week of work    Time 6    Period Weeks    Status New    Target Date 09/23/20      PT LONG TERM GOAL #3   Title Pt will demo A/ROM of Lt shoulder to within 10 deg of Rt shoulder with min pain    Time 6    Period Weeks    Status New    Target Date 09/23/20      PT LONG TERM GOAL #4   Title Pt will achieve Lt shoulder strength of at least 4+/5 tested in functional positions for pull, push, shoulder rotation, and overhead press with min or no pain    Baseline 4/5 for abd, ER, flexion arm tested at side, pain with testing    Time 6    Period Weeks    Status New    Target Date 09/23/20      PT LONG TERM GOAL #5   Title Improve FOTO to at least 79% to demo improved function of Lt UE    Baseline 54%    Time 6    Period Weeks    Status New    Target Date 09/23/20                   Plan - 08/23/20 1432     Clinical Impression Statement Pt continues to have Lt shoulder pain at night that interrupts sleep >2 episodes and with work task of donning/doffing work backpack.  Pain can reach 6/10 at work.  Once it starts after donning the pack it reduces as he wears the pack but doesn't go away.  He has  been compliant with HEP and was able to progress to standing shoulder flexion and abd with weights today with min pain.  PT also added standing horiz abd and diagonals with green band with good tolerance, although Pt reported shoulder fatigue after today's ther ex circuit.  HEP updated today for new ther ex.  Ionto patch #3 placed today given ongoing anterior shoulder tenderness.    PT  Frequency 2x / week    PT Duration 6 weeks    PT Treatment/Interventions ADLs/Self Care Home Management;Cryotherapy;Electrical Stimulation;Iontophoresis 4mg /ml Dexamethasone;Functional mobility training;Therapeutic activities;Therapeutic exercise;Neuromuscular re-education;Manual techniques;Patient/family education;Passive range of motion;Dry needling;Joint Manipulations;Taping    PT Next Visit Plan ionto #4 to bicep tendon or RC tendons, review HEP, progress ther ex as tol    PT Home Exercise Plan Access Code: [email or web address] (blue band for shoulder ther ex)    Consulted and Agree with Plan of Care Patient             Patient will benefit from skilled therapeutic intervention in order to improve the following deficits and impairments:     Visit Diagnosis: Acute pain of left shoulder  Muscle weakness (generalized)  Abnormal posture     Problem List Patient Active Problem List   Diagnosis Date Noted   Morbid obesity (HCC) 11/11/2017   Asthma, intermittent, uncomplicated 04/12/2016   Obesity, pediatric, BMI 95th to 98th percentile for age 49/05/2016   Allergic rhinitis 04/12/2016   06/12/2016, PT 08/23/20 2:38 PM    [facility] [address], [address] [place], [place], [place] Phone: [phone]   Fax:  [phone]  Name: Benjamin Berry MRN: Dan Humphreys Date of Birth: 2000/07/02

## 2020-08-26 ENCOUNTER — Encounter: Payer: Self-pay | Admitting: Physical Therapy

## 2020-08-26 ENCOUNTER — Other Ambulatory Visit: Payer: Self-pay

## 2020-08-26 ENCOUNTER — Ambulatory Visit: Payer: Worker's Compensation | Admitting: Physical Therapy

## 2020-08-26 DIAGNOSIS — R293 Abnormal posture: Secondary | ICD-10-CM

## 2020-08-26 DIAGNOSIS — M25512 Pain in left shoulder: Secondary | ICD-10-CM

## 2020-08-26 DIAGNOSIS — M6281 Muscle weakness (generalized): Secondary | ICD-10-CM

## 2020-08-26 NOTE — Patient Instructions (Signed)
Access Code: [email or web address] URL: [email or web address] Date: 08/26/2020 Prepared by: Loistine Simas Kingsly Kloepfer  Exercises Standing Shoulder Flexion AAROM - 3 x daily - 7 x weekly - 1 sets - 10 reps Supine Shoulder Flexion Overhead with Dowel - 3 x daily - 7 x weekly - 1 sets - 10 reps Standing Shoulder Flexion to 90 Degrees - 3 x daily - 7 x weekly - 1 sets - 10 reps Standing Bilateral Low Shoulder Row with Anchored Resistance - 2 x daily - 7 x weekly - 2 sets - 10 reps Single Arm Shoulder Extension with Anchored Resistance - 2 x daily - 7 x weekly - 2 sets - 10 reps Standing Shoulder External Rotation with Resistance - 2 x daily - 7 x weekly - 2 sets - 10 reps Sidelying Shoulder ER with Towel and Dumbbell - 1 x daily - 7 x weekly - 2 sets - 10 reps Wall Push Up - 1 x daily - 7 x weekly - 2 sets - 10 reps Standing Shoulder Flexion to 90 Degrees with Dumbbells - 1 x daily - 7 x weekly - 2 sets - 10 reps Supine Shoulder Circles with Weight - 1 x daily - 7 x weekly - 1 sets - 20 reps Standing Single Arm Shoulder Abduction with Dumbbell - Palm Down - 1 x daily - 7 x weekly - 2 sets - 10 reps Standing Shoulder Horizontal Abduction with Resistance - 1 x daily - 7 x weekly - 2 sets - 10 reps Standing Shoulder Diagonal Horizontal Abduction 60/120 Degrees with Resistance - 1 x daily - 7 x weekly - 2 sets - 5 reps Bent Over Single Arm Shoulder Row with Dumbbell - 1 x daily - 7 x weekly - 2 sets - 10 reps Low Trap Setting at Wall - 1 x daily - 7 x weekly - 2 sets - 10 reps Standing Low Trap Setting with Resistance at Wall - 1 x daily - 7 x weekly - 2 sets - 5 reps

## 2020-08-26 NOTE — Therapy (Signed)
[facility] [address], [address] [place], [place], [address] Phone: [phone]   Fax:  [phone]  Physical Therapy Treatment  Patient Details  Name: Benjamin Berry MRN: [id number] Date of Birth: 10-Nov-2000 Referring Provider (PT): [place], Betty G, MD   Encounter Date: 08/26/2020   PT End of Session - 08/26/20 1059     Visit Number 5    Date for PT Re-Evaluation 09/23/20    Authorization Type [facility], medical review after 25 visits    Authorization - Visit Number 5    Authorization - Number of Visits 25    PT Start Time 1100    PT Stop Time 1145    PT Time Calculation (min) 45 min    Activity Tolerance Patient tolerated treatment well    Behavior During Therapy [facility] for tasks assessed/performed             Past Medical History:  Diagnosis Date   Allergy    Asthma     History reviewed. No pertinent surgical history.  There were no vitals filed for this visit.   Subjective Assessment - 08/26/20 1059     Subjective I had ongoing pain at work yesterday but did sleep better with less pain last night.  I'm off today.  HEP is good but I had some pain with weighted shoulder abduction (has 5lb dumbbell).    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 Yes    Pain Score 5     Pain Location Shoulder    Pain Orientation Left;Anterior;Posterior    Pain Descriptors / Indicators Aching    Pain Type Acute pain;Chronic pain    Pain Onset More than a month ago    Pain Frequency Intermittent    Aggravating Factors  donning/doffing work backpack    Pain Relieving Factors ice, Tylenol, exercises                               [facility] Adult PT Treatment/Exercise  - 08/26/20 0001       Exercises   Exercises Shoulder      Shoulder Exercises: Standing   Horizontal ABduction Strengthening;Both;Theraband;10 reps    Theraband Level (Shoulder Horizontal ABduction) Level 3 (Green)    External Rotation Strengthening;Left;15 reps;Theraband    Theraband Level (Shoulder External Rotation) Level 4 (Blue)    External Rotation Limitations 2x15    Internal Rotation Strengthening;Left;15 reps;Theraband    Theraband Level (Shoulder Internal Rotation) Level 4 (Blue)    Internal Rotation Limitations 2x15    Flexion Strengthening;Left;Weights;5 reps    Shoulder Flexion Weight (lbs) 5    Flexion Limitations to 110 deg    ABduction Strengthening;Left;10 reps;Weights    Shoulder ABduction Weight (lbs) 5    Extension Strengthening;Left;15 reps;Theraband    Theraband Level (Shoulder Extension) Level 4 (Blue)    Other Standing Exercises blue tricep press downs x15, Lt    Other Standing Exercises lower trap wall lift offs bil x 10 reps      Shoulder Exercises: ROM/Strengthening   UBE (Upper Arm Bike) L2.5 2x2 fwd/bwd, PT present to review status      Shoulder Exercises: Power [employer]  Row 20 reps    Row Limitations 30lb    Other Power Tower Exercises Lt chest press x 15, 15lb      Cryotherapy   Number Minutes Cryotherapy 8 Minutes   concurrent with ionto patch and HEP update prep   Cryotherapy Location Shoulder    Type of Cryotherapy Ice pack      Iontophoresis   Type of Iontophoresis Dexamethasone    Location #4, Lt RC tendon    Dose 4mg /mL    Time 4-6 hour wear                    PT Education - 08/26/20 1136     Education Details added lower trap focused ther ex today    Person(s) Educated Patient    Methods Explanation;Demonstration    Comprehension Verbalized understanding;Returned demonstration              PT Short Term Goals - 08/26/20 1155       PT SHORT TERM GOAL #1   Title Pt will be ind with initial HEP    Status  Achieved      PT SHORT TERM GOAL #2   Title Pt will be able to perform work tasks with min exacerbation of pain    Status On-going      PT SHORT TERM GOAL #3   Title Pt will improve sleep to waking no more than 1x/night due to pain    Baseline slept better 1 night, will wait to see if consistent before achieved    Status On-going      PT SHORT TERM GOAL #4   Title Pt will achieve Lt shoulder elevation to at least 140 deg with min pain    Status Achieved               PT Long Term Goals - 08/26/20 1155       PT LONG TERM GOAL #1   Title Pt will be ind with advanced HEP and understand how to safely progress    Status On-going      PT LONG TERM GOAL #2   Title Pt will be able to complete work day and sleep through night with min or no pain    Status On-going      PT LONG TERM GOAL #3   Title Pt will demo A/ROM of Lt shoulder to within 10 deg of Rt shoulder with min pain    Status Achieved      PT LONG TERM GOAL #4   Title Pt will achieve Lt shoulder strength of at least 4+/5 tested in functional positions for pull, push, shoulder rotation, and overhead press with min or no pain    Status On-going      PT LONG TERM GOAL #5   Title Improve FOTO to at least 79% to demo improved function of Lt UE    Baseline 54%    Status New                   Plan - 08/26/20 1108     Clinical Impression Statement Pt has had recent improvement of Lt shoulder pain both at night (slept better last night) and at work (had a good day since last here) but notes increased Lt shoulder pain at work on longer days.  He had a long day yesterday and noted more pain as the day progressed.  Today's pain rating was 5/10 and located more along RC tendons with  improved anterior tenderness and pain along bicep tendon.  He has ongoing weakness in Lt lower trap and middle trap so PT added focused lower trap setting against wall today with initial need for demo and TC.  PT progressed HEP today for  lower trap ther ex.  PT encouraged Pt to modify abd with weight to bent elbow to shorten level due to pain with long lever.  Ice and ionto patch #4 applied today (to RC tendons).  Continue along POC.    Rehab Potential Excellent    PT Frequency 2x / week    PT Duration 6 weeks    PT Treatment/Interventions ADLs/Self Care Home Management;Cryotherapy;Electrical Stimulation;Iontophoresis 4mg /ml Dexamethasone;Functional mobility training;Therapeutic activities;Therapeutic exercise;Neuromuscular re-education;Manual techniques;Patient/family education;Passive range of motion;Dry needling;Joint Manipulations;Taping    PT Next Visit Plan ionto #5 to RC tendons, review lower trap off wall and green loop clock against wall, prone Lt Y off table, progress ther ex as tol    PT Home Exercise Plan Access Code: [email or web address] (blue band for shoulder ther ex)    Consulted and Agree with Plan of Care Patient             Patient will benefit from skilled therapeutic intervention in order to improve the following deficits and impairments:     Visit Diagnosis: Acute pain of left shoulder  Muscle weakness (generalized)  Abnormal posture     Problem List Patient Active Problem List   Diagnosis Date Noted   Morbid obesity (HCC) 11/11/2017   Asthma, intermittent, uncomplicated 04/12/2016   Obesity, pediatric, BMI 95th to 98th percentile for age 03/12/2016   Allergic rhinitis 04/12/2016    06/12/2016, PT 08/26/20 11:57 AM   [facility] [address] [place], [place], [place] Phone: [phone]   Fax:  [phone]  Name: Benjamin Berry MRN: Dan Humphreys Date of Birth: 2000-03-08

## 2020-08-30 ENCOUNTER — Encounter: Payer: Self-pay | Admitting: Physical Therapy

## 2020-08-30 ENCOUNTER — Other Ambulatory Visit: Payer: Self-pay

## 2020-08-30 ENCOUNTER — Ambulatory Visit: Payer: Worker's Compensation | Admitting: Physical Therapy

## 2020-08-30 DIAGNOSIS — R293 Abnormal posture: Secondary | ICD-10-CM | POA: Diagnosis not present

## 2020-08-30 DIAGNOSIS — M25512 Pain in left shoulder: Secondary | ICD-10-CM | POA: Diagnosis not present

## 2020-08-30 DIAGNOSIS — M6281 Muscle weakness (generalized): Secondary | ICD-10-CM

## 2020-08-30 NOTE — Therapy (Signed)
[facility] [address], [place], [place], [address] Phone: [phone]   Fax:  [phone]  Physical Therapy Treatment  Patient Details  Name: Benjamin Berry MRN: [id number] Date of Birth: [birth year] Referring Provider (PT): [place], Betty G, MD   Encounter Date: 08/30/2020   PT End of Session - 08/30/20 1357     Visit Number 6    Date for PT Re-Evaluation 09/23/20    Authorization Type [facility], medical review after 25 visits    Authorization - Visit Number 6    Authorization - Number of Visits 25    PT Start Time 1400    PT Stop Time 1440    PT Time Calculation (min) 40 min    Activity Tolerance Patient tolerated treatment well    Behavior During Therapy [facility] for tasks assessed/performed             Past Medical History:  Diagnosis Date   Allergy    Asthma     History reviewed. No pertinent surgical history.  There were no vitals filed for this visit.   Subjective Assessment - 08/30/20 1401     Subjective Today's work day was much better.  I had much less pain even with taking the backpack on and off.  The new exercises are good but I do have some pain with the band clock drill on the wall when pressing out to side and at diagonal.    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 Yes    Pain Score 3     Pain Location Shoulder    Pain Orientation Left;Anterior;Posterior    Pain Descriptors / Indicators Aching                               [facility] Adult PT Treatment/Exercise - 08/30/20 0001       Exercises   Exercises Shoulder      Shoulder Exercises: Prone   Extension Strengthening;Left;10 reps;Weights    Extension Weight (lbs) 5     Other Prone Exercises row 5lb x 10, TC for scapular retraction    Other Prone Exercises lower trap Y raise from floor  x 10, Lt      Shoulder Exercises: Standing   Horizontal ABduction Strengthening;Both;Theraband;15 reps    Theraband Level (Shoulder Horizontal ABduction) Level 4 (Blue)    External Rotation Strengthening;Theraband;20 reps    Theraband Level (Shoulder External Rotation) Level 4 (Blue)    Internal Rotation Strengthening;Theraband;20 reps    Theraband Level (Shoulder Internal Rotation) Level 4 (Blue)    Flexion Strengthening;Weights;5 reps    Shoulder Flexion Weight (lbs) 5    Diagonals Strengthening;Left;5 reps;Weights    Diagonals Weight (lbs) 3    Other Standing Exercises red loop band 90/90 arms against wall, ER tension isometric 10x3"    Other Standing Exercises 5lb wall slide clocks Lt UE x 5 rounds      Shoulder Exercises: ROM/Strengthening   UBE (Upper Arm Bike) L2.5 x 2.5' fwd/bwd each way, PT present to discuss symptoms      Shoulder Exercises: Power [employer] 20 reps    Row Limitations 35lb  Other Power [employer], water Lt chest press x 15, 15lb      Cryotherapy   Number Minutes Cryotherapy 8 Minutes   concurrent with ionto patch prep   Type of Cryotherapy Ice pack      Iontophoresis   Type of Iontophoresis Dexamethasone    Location #5, Lt RC tendon    Dose 46m/mL    Time 4-6 hour wear                      PT Short Term Goals - 08/30/20 1358       PT SHORT TERM GOAL #1   Title Pt will be ind with initial HEP    Status Achieved      PT SHORT TERM GOAL #2   Title Pt will be able to perform work tasks with min exacerbation of pain    Status Achieved      PT SHORT TERM GOAL #3   Title Pt will improve sleep to waking no more than 1x/night due to pain    Status Achieved      PT SHORT TERM GOAL #4   Title Pt will achieve Lt shoulder elevation to at least 140 deg with min pain    Status Achieved               PT Long  Term Goals - 08/26/20 1155       PT LONG TERM GOAL #1   Title Pt will be ind with advanced HEP and understand how to safely progress    Status On-going      PT LONG TERM GOAL #2   Title Pt will be able to complete work day and sleep through night with min or no pain    Status On-going      PT LONG TERM GOAL #3   Title Pt will demo A/ROM of Lt shoulder to within 10 deg of Rt shoulder with min pain    Status Achieved      PT LONG TERM GOAL #4   Title Pt will achieve Lt shoulder strength of at least 4+/5 tested in functional positions for pull, push, shoulder rotation, and overhead press with min or no pain    Status On-going      PT LONG TERM GOAL #5   Title Improve FOTO to at least 79% to demo improved function of Lt UE    Baseline 54%    Status New                   Plan - 08/30/20 1402     Clinical Impression Statement Pt had much less pain with work day today.  He is also sleeping with minimal disruption.  He has now met all STGs.  He continues to progress with Lt shoulder strengthening with good tolerance and is compliant with HEP.  PT continues to provide TC and VC for optimal scapular control and activation especially of lower trap.  He was able to increase weights, reps and positional advancement against gravity for selected exercises today.  He demos full Lt shoulder AROM for flexion and abduction with pain only on lowering from full abduction.  He has ongoing although less severe tenderness along Lt RC tendons so ionto patch #5 placed end of session.  Continue along POC.    Comorbidities obesity    Stability/Clinical Decision Making Stable/Uncomplicated    Rehab Potential Excellent    PT Frequency 2x / week    PT  Duration 6 weeks    PT Next Visit Plan ionto #6 to RC tendons, review lower trap off wall and green loop clock against wall, prone Lt Y off table, progress ther ex as tol    PT Home Exercise Plan Access Code: [email or web address] (blue band for shoulder ther ex)     Consulted and Agree with Plan of Care Patient             Patient will benefit from skilled therapeutic intervention in order to improve the following deficits and impairments:     Visit Diagnosis: Acute pain of left shoulder  Muscle weakness (generalized)  Abnormal posture     Problem List Patient Active Problem List   Diagnosis Date Noted   Morbid obesity ([place]) 11/11/2017   Asthma, intermittent, uncomplicated 76/18/4859   Obesity, pediatric, BMI 95th to 98th percentile for age 20/05/2016   Allergic rhinitis 04/12/2016   Baruch Merl, PT 08/30/20 2:39 PM   [facility] [address], [place], [place], [address] Phone: [phone]   Fax:  [phone]  Name: Benjamin Berry MRN: [id number] Date of Birth: 2000-12-05

## 2020-09-02 ENCOUNTER — Encounter: Payer: Self-pay | Admitting: Physical Therapy

## 2020-09-02 ENCOUNTER — Other Ambulatory Visit: Payer: Self-pay

## 2020-09-02 ENCOUNTER — Ambulatory Visit: Payer: Worker's Compensation | Admitting: Physical Therapy

## 2020-09-02 DIAGNOSIS — R293 Abnormal posture: Secondary | ICD-10-CM | POA: Diagnosis not present

## 2020-09-02 DIAGNOSIS — M25512 Pain in left shoulder: Secondary | ICD-10-CM | POA: Diagnosis not present

## 2020-09-02 DIAGNOSIS — M6281 Muscle weakness (generalized): Secondary | ICD-10-CM

## 2020-09-02 NOTE — Therapy (Signed)
[facility] [address] [place], [place], [address] Phone: [phone]   Fax:  [phone]  Physical Therapy Treatment  Patient Details  Name: Benjamin Berry MRN: [id number] Date of Birth: 01-29-2000 Referring Provider (PT): [place], Betty G, MD   Encounter Date: 09/02/2020   PT End of Session - 09/02/20 1110     Visit Number 7    Date for PT Re-Evaluation 09/23/20    Authorization Type [facility], medical review after 25 visits    Authorization - Visit Number 7    Authorization - Number of Visits 25    PT Start Time 1107   PT running late   PT Stop Time 1151    PT Time Calculation (min) 44 min    Activity Tolerance Patient tolerated treatment well    Behavior During Therapy [facility] for tasks assessed/performed             Past Medical History:  Diagnosis Date   Allergy    Asthma     History reviewed. No pertinent surgical history.  There were no vitals filed for this visit.   Subjective Assessment - 09/02/20 1108     Subjective I feel 70% better in my shoulder since starting PT.  I had a good day and a not as good day for work this week.  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 Yes    Pain Score 5     Pain Location Shoulder    Pain Orientation Left;Anterior;Posterior    Pain Descriptors / Indicators Aching    Pain Type Chronic pain    Pain Onset More than a month ago    Pain Frequency Intermittent    Aggravating Factors  donning/doffing work backpack    Pain Relieving Factors ice, Tylenol, HEP                               [facility] Adult PT  Treatment/Exercise - 09/02/20 0001       Exercises   Exercises Shoulder      Shoulder Exercises: Standing   Horizontal ABduction Strengthening;Both;Theraband;20 reps    Theraband Level (Shoulder Horizontal ABduction) Level 4 (Blue)    External Rotation Strengthening;Theraband;20 reps;Left    Theraband Level (Shoulder External Rotation) Level 4 (Blue)    Internal Rotation Strengthening;Theraband;20 reps    Theraband Level (Shoulder Internal Rotation) Level 4 (Blue)    Flexion Strengthening;Weights;5 reps    Shoulder Flexion Weight (lbs) 5      Shoulder Exercises: ROM/Strengthening   UBE (Upper Arm Bike) L2.5 x 3' fwd/bwd each way, PT present to discuss symptoms    Lat Pull Limitations 30lb x 20 reps    Wall Wash 2x10 Lt UE using red swiss ball on wall    Wall Pushups 10 reps    Wall Pushups Limitations 1x10 hands on wall, 1x10 hands on red swiss ball against wall    Proximal Shoulder Strengthening, Supine 5lb Lt T x 5 rounds      Shoulder Exercises: Power [employer] 20 reps    Row Limitations  35lb      Cryotherapy   Number Minutes Cryotherapy 8 Minutes   concurrent with ionto patch prep   Type of Cryotherapy Ice pack      Iontophoresis   Type of Iontophoresis Dexamethasone    Location #6, Lt RC tendon    Dose 4mg /mL    Time 4-6 hour wear      Manual Therapy   Manual Therapy Soft tissue mobilization    Soft tissue mobilization Addaday assisted STM to Lt deltoid, teres minor/major, infraspinatus, upper trap in Rt SL                      PT Short Term Goals - 08/30/20 1358       PT SHORT TERM GOAL #1   Title Pt will be ind with initial HEP    Status Achieved      PT SHORT TERM GOAL #2   Title Pt will be able to perform work tasks with min exacerbation of pain    Status Achieved      PT SHORT TERM GOAL #3   Title Pt will improve sleep to waking no more than 1x/night due to pain    Status Achieved      PT SHORT TERM GOAL #4   Title Pt will achieve Lt  shoulder elevation to at least 140 deg with min pain    Status Achieved               PT Long Term Goals - 08/26/20 1155       PT LONG TERM GOAL #1   Title Pt will be ind with advanced HEP and understand how to safely progress    Status On-going      PT LONG TERM GOAL #2   Title Pt will be able to complete work day and sleep through night with min or no pain    Status On-going      PT LONG TERM GOAL #3   Title Pt will demo A/ROM of Lt shoulder to within 10 deg of Rt shoulder with min pain    Status Achieved      PT LONG TERM GOAL #4   Title Pt will achieve Lt shoulder strength of at least 4+/5 tested in functional positions for pull, push, shoulder rotation, and overhead press with min or no pain    Status On-going      PT LONG TERM GOAL #5   Title Improve FOTO to at least 79% to demo improved function of Lt UE    Baseline 54%    Status New                   Plan - 09/02/20 1146     Clinical Impression Statement Pt reports 70% improvement in Lt shoulder pain and strength since starting PT.  He has intermittent pain at work vs consistent pain with each work day.  He is now sleeping without pain interruption in a variety of position options now.  He gets tight across deltoid and posterior shoulder with ther ex so PT added Addaday assisted STM today end of session prior to ice and ionto patch #6.  Pt needs intermittent TC to encourage more scapular retraction and depression to avoid anterior drift and tilt of scapula with end range row, horiz abd and lat pulldowns.  Continue along POC with ongoing assessment.    PT Frequency 2x / week    PT Duration 6 weeks  PT Treatment/Interventions ADLs/Self Care Home Management;Cryotherapy;Electrical Stimulation;Iontophoresis 4mg /ml Dexamethasone;Functional mobility training;Therapeutic activities;Therapeutic exercise;Neuromuscular re-education;Manual techniques;Patient/family education;Passive range of motion;Dry needling;Joint  Manipulations;Taping    PT Next Visit Plan continue Lt shoulder strength with focus on scapular engagement, Addaday assisted STM    PT Home Exercise Plan Access Code: (blue band for shoulder ther ex)    Consulted and Agree with Plan of Care Patient             Patient will benefit from skilled therapeutic intervention in order to improve the following deficits and impairments:     Visit Diagnosis: Acute pain of left shoulder  Muscle weakness (generalized)  Abnormal posture     Problem List Patient Active Problem List   Diagnosis Date Noted   Morbid obesity ([facility]) 11/11/2017   Asthma, intermittent, uncomplicated 04/12/2016   Obesity, pediatric, BMI 95th to 98th percentile for age 58/05/2016   Allergic rhinitis 04/12/2016    06/12/2016, PT 09/02/20 11:54 AM   [facility] [address] [place], [place], [place] Phone: [phone]   Fax:  [phone]  Name: Benjamin Berry MRN: Dan Humphreys Date of Birth: Jan 24, 2000

## 2020-09-06 ENCOUNTER — Ambulatory Visit: Payer: Worker's Compensation | Admitting: Physical Therapy

## 2020-09-06 ENCOUNTER — Other Ambulatory Visit: Payer: Self-pay

## 2020-09-06 ENCOUNTER — Encounter: Payer: Self-pay | Admitting: Physical Therapy

## 2020-09-06 DIAGNOSIS — M25512 Pain in left shoulder: Secondary | ICD-10-CM | POA: Diagnosis not present

## 2020-09-06 DIAGNOSIS — M6281 Muscle weakness (generalized): Secondary | ICD-10-CM | POA: Diagnosis not present

## 2020-09-06 DIAGNOSIS — R293 Abnormal posture: Secondary | ICD-10-CM | POA: Diagnosis not present

## 2020-09-06 NOTE — Therapy (Signed)
[facility] [address] [place], [place], [address] Phone: [phone]   Fax:  [phone]  Physical Therapy Treatment  Patient Details  Name: Benjamin Berry MRN: [id number] Date of Birth: 11-08-2000 Referring Provider (PT): [place], Betty G, MD   Encounter Date: 09/06/2020   PT End of Session - 09/06/20 1358     Visit Number 8    Date for PT Re-Evaluation 09/23/20    Authorization Type [facility], medical review after 25 visits    Authorization - Visit Number 8    Authorization - Number of Visits 25    PT Start Time 1400    PT Stop Time 1450   ice x 10' end of session   PT Time Calculation (min) 50 min    Activity Tolerance Patient tolerated treatment well    Behavior During Therapy [facility] for tasks assessed/performed             Past Medical History:  Diagnosis Date   Allergy    Asthma     History reviewed. No pertinent surgical history.  There were no vitals filed for this visit.   Subjective Assessment - 09/06/20 1358     Subjective Shoulder was fine over the weekend.  I've been at work today and the shoulder is hurting a bit.  4/10.    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 Yes    Pain Score 4     Pain Location Shoulder    Pain Orientation Left;Lateral;Posterior    Pain Descriptors / Indicators Aching    Pain Type Chronic pain    Pain Onset More than a month ago    Pain Frequency Intermittent                [facility] PT Assessment - 09/06/20 0001       Observation/Other Assessments   Focus on Therapeutic Outcomes (FOTO)  50%                           [facility] Adult PT Treatment/Exercise - 09/06/20 0001       Exercises   Exercises Shoulder       Shoulder Exercises: Supine   Protraction Strengthening;Left;Weights;15 reps    Protraction Weight (lbs) 5    Horizontal ABduction Strengthening;Both;Theraband    Theraband Level (Shoulder Horizontal ABduction) Level 4 (Blue)    Horizontal ABduction Limitations 3x10    Diagonals Strengthening;Left;10 reps;Weights    Diagonals Weight (lbs) 4    Diagonals Limitations d2 flexion      Shoulder Exercises: Prone   Extension Strengthening;Left;Weights;10 reps    Extension Weight (lbs) 5    Horizontal ABduction 1 Left;10 reps    Horizontal ABduction 1 Limitations 1x10 each thumb up/down, PT assist scapula    Other Prone Exercises tricep kickbacks 5lb x 10 reps, Lt      Shoulder Exercises: Sidelying   External Rotation Strengthening;Left;20 reps;Weights    External Rotation Weight (lbs) 4    External Rotation Limitations 1x10 5lb, 1x10 4lb      Shoulder Exercises: Standing   Other Standing Exercises push ups x 10 hands on red ball against wall    Other Standing Exercises lower trap setting green loop  against wall 8x5" holds      Shoulder Exercises: ROM/Strengthening   UBE (Upper Arm Bike) L2.5 3x3 fwd/bwd, PT present to discuss progress    Lat Pull Limitations 35lb 2x10    Ball on Wall 20x each way, Lt    Other ROM/Strengthening Exercises wall slide overhead press 5lb Lt shoulder x 10 rounds      Shoulder Exercises: Power [employer] 20 reps    Row Limitations 40lb, 2x10      Modalities   Modalities Cryotherapy      Cryotherapy   Number Minutes Cryotherapy 8 Minutes    Cryotherapy Location Shoulder   Lt   Type of Cryotherapy Ice pack      Manual Therapy   Manual Therapy Soft tissue mobilization    Soft tissue mobilization Addaday assisted STM to Lt deltoid, teres minor/major, infraspinatus, upper trap in Rt SL                      PT Short Term Goals - 09/06/20 1359       PT SHORT TERM GOAL #1   Title Pt will be ind with initial HEP    Status Achieved       PT SHORT TERM GOAL #2   Title Pt will be able to perform work tasks with min exacerbation of pain    Status Achieved      PT SHORT TERM GOAL #3   Title Pt will improve sleep to waking no more than 1x/night due to pain    Status Achieved      PT SHORT TERM GOAL #4   Title Pt will achieve Lt shoulder elevation to at least 140 deg with min pain    Status Achieved               PT Long Term Goals - 09/06/20 1359       PT LONG TERM GOAL #1   Title Pt will be ind with advanced HEP and understand how to safely progress    Status On-going      PT LONG TERM GOAL #2   Title Pt will be able to complete work day and sleep through night with min or no pain    Status On-going      PT LONG TERM GOAL #3   Title Pt will demo A/ROM of Lt shoulder to within 10 deg of Rt shoulder with min pain    Status Achieved      PT LONG TERM GOAL #4   Title Pt will achieve Lt shoulder strength of at least 4+/5 tested in functional positions for pull, push, shoulder rotation, and overhead press with min or no pain                   Plan - 09/06/20 1441     Clinical Impression Statement Pt making good progress on strength and stability of Lt shoulder complex.  He continues to sleep without shoulder pain and has intermittent anterior and lateral shoulder pain on the job.  FOTO score regressed to 50% today but Pt does report he feels 70% improvement in shoulder since starting PT so may not be good measure of progress for Pt.  He needed TC to guide scapular mechanics due to ongoing weakness and control deficit of middle and lower trap with some ther ex today, most notably prone horiz abduction.  PT used Addaday assisted STM along deltoid and bicep tendon end of session  with ice to follow for slight increase in pain with ther ex today (from 4 to 5/10)    PT Frequency 2x / week    PT Duration 6 weeks    PT Treatment/Interventions ADLs/Self Care Home Management;Cryotherapy;Electrical  Stimulation;Iontophoresis 4mg /ml Dexamethasone;Functional mobility training;Therapeutic activities;Therapeutic exercise;Neuromuscular re-education;Manual techniques;Patient/family education;Passive range of motion;Dry needling;Joint Manipulations;Taping    PT Next Visit Plan continue Lt shoulder strength with focus on scapular engagement, Addaday assisted STM    PT Home Exercise Plan Access Code: (blue band for shoulder ther ex)    Consulted and Agree with Plan of Care Patient             Patient will benefit from skilled therapeutic intervention in order to improve the following deficits and impairments:     Visit Diagnosis: Acute pain of left shoulder  Muscle weakness (generalized)  Abnormal posture     Problem List Patient Active Problem List   Diagnosis Date Noted   Morbid obesity (HCC) 11/11/2017   Asthma, intermittent, uncomplicated 04/12/2016   Obesity, pediatric, BMI 95th to 98th percentile for age 68/05/2016   Allergic rhinitis 04/12/2016    06/12/2016, PT 09/06/20 2:45 PM   [facility] [address] [place], [place], [place] Phone: [phone]   Fax:  [phone]  Name: Benjamin Berry MRN: Dan Humphreys Date of Birth: 2000/07/12

## 2020-09-09 ENCOUNTER — Encounter: Payer: Self-pay | Admitting: Physical Therapy

## 2020-09-09 ENCOUNTER — Ambulatory Visit: Payer: Worker's Compensation | Attending: Family Medicine | Admitting: Physical Therapy

## 2020-09-09 ENCOUNTER — Other Ambulatory Visit: Payer: Self-pay

## 2020-09-09 DIAGNOSIS — M6281 Muscle weakness (generalized): Secondary | ICD-10-CM | POA: Insufficient documentation

## 2020-09-09 DIAGNOSIS — R293 Abnormal posture: Secondary | ICD-10-CM | POA: Insufficient documentation

## 2020-09-09 DIAGNOSIS — M25512 Pain in left shoulder: Secondary | ICD-10-CM | POA: Diagnosis not present

## 2020-09-09 NOTE — Therapy (Signed)
[facility] [address] [place], [place], [address] Phone: [phone]   Fax:  [phone]  Physical Therapy Treatment  Patient Details  Name: Benjamin Berry MRN: [id number] Date of Birth: [birth year] Referring Provider (PT): [place], Betty G, MD   Encounter Date: 09/09/2020   PT End of Session - 09/09/20 1100     Visit Number 9    Date for PT Re-Evaluation 09/23/20    Authorization Type [facility], medical review after 25 visits    Authorization - Visit Number 9    Authorization - Number of Visits 25    PT Start Time 1101    PT Stop Time 1150    PT Time Calculation (min) 49 min    Activity Tolerance Patient tolerated treatment well    Behavior During Therapy [facility] for tasks assessed/performed             Past Medical History:  Diagnosis Date   Allergy    Asthma     History reviewed. No pertinent surgical history.  There were no vitals filed for this visit.   Subjective Assessment - 09/09/20 1104     Subjective I had a pretty good day at work yesterday, minimal pain.  No pain today.    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 Yes    Pain Score 0-No pain    Pain Location Shoulder    Pain Orientation Left                               [facility] Adult PT Treatment/Exercise - 09/09/20 0001       Exercises   Exercises Shoulder;Elbow      Elbow Exercises   Elbow Flexion Strengthening;Left;10 reps;Standing    Elbow Flexion Limitations 10lb dumbbell, focus on eccentric lowering      Shoulder Exercises: Standing   External Rotation Strengthening;Left;Theraband    Theraband Level (Shoulder External Rotation) Level 2 (Red);Level 4 (Blue)     External Rotation Limitations 2x10 blue, 1x10 red at 90/90    Other Standing Exercises mat table plank 2x10 sec, 5 push ups from mat table    Other Standing Exercises lower trap setting wall lift offs 2x10 bil, bent over flys unweighted 2x10      Shoulder Exercises: ROM/Strengthening   UBE (Upper Arm Bike) L2.5 3x3 fwd/bwd, PT present to discuss progress    Lat Pull Limitations 35lb 2x10      Shoulder Exercises: Power [employer] 20 reps    Row Limitations 45lb single arm, Lt, small range      Modalities   Modalities Cryotherapy      Cryotherapy   Number Minutes Cryotherapy 10 Minutes    Cryotherapy Location Shoulder   Lt   Type of Cryotherapy Ice pack                      PT Short Term Goals - 09/06/20 1359       PT SHORT TERM GOAL #1   Title Pt will be ind with initial HEP    Status Achieved      PT SHORT TERM GOAL #2  Title Pt will be able to perform work tasks with min exacerbation of pain    Status Achieved      PT SHORT TERM GOAL #3   Title Pt will improve sleep to waking no more than 1x/night due to pain    Status Achieved      PT SHORT TERM GOAL #4   Title Pt will achieve Lt shoulder elevation to at least 140 deg with min pain    Status Achieved               PT Long Term Goals - 09/06/20 1359       PT LONG TERM GOAL #1   Title Pt will be ind with advanced HEP and understand how to safely progress    Status On-going      PT LONG TERM GOAL #2   Title Pt will be able to complete work day and sleep through night with min or no pain    Status On-going      PT LONG TERM GOAL #3   Title Pt will demo A/ROM of Lt shoulder to within 10 deg of Rt shoulder with min pain    Status Achieved      PT LONG TERM GOAL #4   Title Pt will achieve Lt shoulder strength of at least 4+/5 tested in functional positions for pull, push, shoulder rotation, and overhead press with min or no pain                   Plan - 09/09/20 1146     Clinical  Impression Statement Pt displayed much improved awareness of middle and lower trap and lat today compared to previous visits. He is able to center humeral head and stabilize scapula with ther ex.  He initiated bent over fly and lower trap wall lift offs from scapula consistently today.  He reported minimal pain at work yesterday and arrived painfree. Pt noted some anterior shoulder pain along bicep tendon with addition of Lt elbow flexion with 10lb today so PT intructed him to focus on lighter weight at home with eccentric phase slow control.  Pt will continue to benefit from skilled PT to advance Lt shoulder strength for more painfree days at work.    PT Frequency 2x / week    PT Duration 6 weeks    PT Treatment/Interventions ADLs/Self Care Home Management;Cryotherapy;Electrical Stimulation;Iontophoresis 4mg /ml Dexamethasone;Functional mobility training;Therapeutic activities;Therapeutic exercise;Neuromuscular re-education;Manual techniques;Patient/family education;Passive range of motion;Dry needling;Joint Manipulations;Taping    PT Next Visit Plan focus on lower and mid trap, lats, centering humeral head, advancing functional ER and bicep strength, continue Lt shoulder strength with focus on scapular engagement, Addaday assisted STM    PT Home Exercise Plan Access Code: (blue band for shoulder ther ex)    Consulted and Agree with Plan of Care Patient             Patient will benefit from skilled therapeutic intervention in order to improve the following deficits and impairments:     Visit Diagnosis: Acute pain of left shoulder  Muscle weakness (generalized)  Abnormal posture     Problem List Patient Active Problem List   Diagnosis Date Noted   Morbid obesity (HCC) 11/11/2017   Asthma, intermittent, uncomplicated 04/12/2016   Obesity, pediatric, BMI 95th to 98th percentile for age 85/05/2016   Allergic rhinitis 04/12/2016    06/12/2016, PT 09/09/20 11:51  AM   [facility] [address] [place], [place], [address] Phone: [phone]   Fax:  [phone]  Name: SOCRATES CAHOON MRN: [id number] Date of Birth: 22-Nov-2000

## 2020-09-13 ENCOUNTER — Ambulatory Visit: Payer: Worker's Compensation | Admitting: Physical Therapy

## 2020-09-13 ENCOUNTER — Encounter: Payer: Self-pay | Admitting: Physical Therapy

## 2020-09-13 ENCOUNTER — Other Ambulatory Visit: Payer: Self-pay

## 2020-09-13 DIAGNOSIS — M25512 Pain in left shoulder: Secondary | ICD-10-CM | POA: Diagnosis not present

## 2020-09-13 DIAGNOSIS — R293 Abnormal posture: Secondary | ICD-10-CM | POA: Diagnosis not present

## 2020-09-13 DIAGNOSIS — M6281 Muscle weakness (generalized): Secondary | ICD-10-CM | POA: Diagnosis not present

## 2020-09-13 NOTE — Therapy (Signed)
[facility] [address] [place], [place], [address] Phone: [phone]   Fax:  [phone]  Physical Therapy Treatment  Patient Details  Name: Benjamin Berry MRN: [id number] Date of Birth: [birth year] Referring Provider (PT): [place], Betty G, MD   Encounter Date: 09/13/2020   PT End of Session - 09/13/20 1351     Visit Number 10    Date for PT Re-Evaluation 09/23/20    Authorization Type [facility], medical review after 25 visits    Authorization - Visit Number 10    Authorization - Number of Visits 25    PT Start Time 1355    PT Stop Time 1445    PT Time Calculation (min) 50 min    Activity Tolerance Patient tolerated treatment well    Behavior During Therapy [facility] for tasks assessed/performed             Past Medical History:  Diagnosis Date   Allergy    Asthma     History reviewed. No pertinent surgical history.  There were no vitals filed for this visit.   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, return to regular function of arm, sleep through night without pain (awakens 2x/night from pain)                               [facility] Adult PT Treatment/Exercise - 09/13/20 0001       Exercises   Exercises Shoulder;Elbow      Elbow Exercises   Elbow Flexion Strengthening;Left;Standing;Bar weights/barbell;20 reps    Elbow Flexion Limitations 7lb, 2x10    Elbow Extension Strengthening;Left;20 reps;Bar weights/barbell    Elbow Extension Limitations 7lb, 2x10, bent over row to ext      Shoulder Exercises: Seated   Other Seated Exercises red loop lower trap flexion 2 x 10      Shoulder Exercises: Standing   Diagonals Strengthening;10 reps;Weights    Diagonals Weight (lbs) 5    Diagonals Limitations clean and  press Lt UE    Other Standing Exercises wall push ups hands on red ball x 10    Other Standing Exercises lower trap lift offs from wall, then bent over fly 2x10 each      Shoulder Exercises: ROM/Strengthening   UBE (Upper Arm Bike) L2.5 3x3 fwd/bwd, PT present to discuss progress    Lat Pull Limitations 35lb 3x10      Shoulder Exercises: Power Scientist, research (medical) (comment)    Row Limitations 55lb, 3x10      Cryotherapy   Number Minutes Cryotherapy 10 Minutes    Cryotherapy Location Shoulder   10   Type of Cryotherapy Ice pack      Manual Therapy   Manual Therapy Soft tissue mobilization    Soft tissue mobilization Addaday assisted STM to Lt deltoid, bicep tendon, lat                      PT Short Term Goals - 09/06/20 1359       PT SHORT TERM GOAL #1   Title Pt will be ind with initial HEP    Status Achieved      PT SHORT TERM GOAL #2   Title  Pt will be able to perform work tasks with min exacerbation of pain    Status Achieved      PT SHORT TERM GOAL #3   Title Pt will improve sleep to waking no more than 1x/night due to pain    Status Achieved      PT SHORT TERM GOAL #4   Title Pt will achieve Lt shoulder elevation to at least 140 deg with min pain    Status Achieved               PT Long Term Goals - 09/06/20 1359       PT LONG TERM GOAL #1   Title Pt will be ind with advanced HEP and understand how to safely progress    Status On-going      PT LONG TERM GOAL #2   Title Pt will be able to complete work day and sleep through night with min or no pain    Status On-going      PT LONG TERM GOAL #3   Title Pt will demo A/ROM of Lt shoulder to within 10 deg of Rt shoulder with min pain    Status Achieved      PT LONG TERM GOAL #4   Title Pt will achieve Lt shoulder strength of at least 4+/5 tested in functional positions for pull, push, shoulder rotation, and overhead press with min or no pain                   Plan - 09/13/20 1358      Clinical Impression Statement Pt reported painfree work day today and feeling like he may be rounding the corner with Lt shoulder.  Pt continues to demo improving recruitment of middle and lower trap with good proximal control of Lt shoulder.  PT increased sets for selected ther ex today to target improved strength now that recruitment pattern has improved.  Pt experienced twinges of pain during session but reported overall fatigue and tightness end of ther ex vs pain.  Addaday assisted STM and ice used after ther ex for soft tissue mobility and soreness along bicep tendon.  Good tolerance throughout session today.  Continue along POC.    PT Frequency 2x / week    PT Duration 6 weeks    PT Treatment/Interventions ADLs/Self Care Home Management;Cryotherapy;Electrical Stimulation;Iontophoresis 4mg /ml Dexamethasone;Functional mobility training;Therapeutic activities;Therapeutic exercise;Neuromuscular re-education;Manual techniques;Patient/family education;Passive range of motion;Dry needling;Joint Manipulations;Taping    PT Next Visit Plan focus on lower and mid trap, lats, centering humeral head, advancing functional ER and bicep strength, continue Lt shoulder strength with focus on scapular engagement, Addaday assisted STM as needed    PT Home Exercise Plan Access Code: (blue band for shoulder ther ex)    Consulted and Agree with Plan of Care Patient             Patient will benefit from skilled therapeutic intervention in order to improve the following deficits and impairments:     Visit Diagnosis: Acute pain of left shoulder  Muscle weakness (generalized)  Abnormal posture     Problem List Patient Active Problem List   Diagnosis Date Noted   Morbid obesity (HCC) 11/11/2017   Asthma, intermittent, uncomplicated 04/12/2016   Obesity, pediatric, BMI 95th to 98th percentile for age 06/12/2016   Allergic rhinitis 04/12/2016    06/12/2016, PT 09/13/20 2:39  PM   [facility] [address] [place], [place], [place] Phone:  [phone]   Fax:  [phone]  Name: Benjamin Berry MRN: [id number] Date of Birth: 2000/11/02

## 2020-09-16 ENCOUNTER — Ambulatory Visit: Payer: Worker's Compensation | Admitting: Physical Therapy

## 2020-09-16 ENCOUNTER — Encounter: Payer: Self-pay | Admitting: Physical Therapy

## 2020-09-16 ENCOUNTER — Other Ambulatory Visit: Payer: Self-pay

## 2020-09-16 DIAGNOSIS — M25512 Pain in left shoulder: Secondary | ICD-10-CM | POA: Diagnosis not present

## 2020-09-16 DIAGNOSIS — M6281 Muscle weakness (generalized): Secondary | ICD-10-CM | POA: Diagnosis not present

## 2020-09-16 DIAGNOSIS — R293 Abnormal posture: Secondary | ICD-10-CM

## 2020-09-16 NOTE — Therapy (Signed)
[facility] [address] [place], [place], [address] Phone: [phone]   Fax:  [phone]  Physical Therapy Treatment  Patient Details  Name: Benjamin Berry MRN: [id number] Date of Birth: October 02, 2000 Referring Provider (PT): [place], Betty G, MD   Encounter Date: 09/16/2020   PT End of Session - 09/16/20 1052     Visit Number 11    Date for PT Re-Evaluation 09/23/20    Authorization Type [facility], medical review after 25 visits    Authorization - Visit Number 11    Authorization - Number of Visits 25    PT Start Time 1055    PT Stop Time 1148    PT Time Calculation (min) 53 min    Activity Tolerance Patient tolerated treatment well    Behavior During Therapy [facility] for tasks assessed/performed             Past Medical History:  Diagnosis Date   Allergy    Asthma     History reviewed. No pertinent surgical history.  There were no vitals filed for this visit.   Subjective Assessment - 09/16/20 1054     Subjective I sleep well now.  I have about 40-50% of work days without pain at this point.  Pain can reach a 5-6/10 with some work days.  Pain is brief when removing the pack and doesn't last.    Limitations Lifting    Diagnostic tests xrays negative for fracture/dislocation    Patient Stated Goals be able to work - needs to wear a 60lb pack for mosquito spraying, return to regular function of arm, sleep through night without pain (awakens 2x/night from pain)    Pain Location Shoulder    Pain Orientation Left    Pain Descriptors / Indicators Tightness    Pain Onset More than a month ago                               [facility] Adult PT Treatment/Exercise - 09/16/20 0001       Exercises   Exercises Shoulder;Elbow      Shoulder Exercises: Prone   Extension Strengthening;Left;10 reps    Horizontal ABduction 1 Strengthening;Left;10 reps    Other Prone Exercises lower trap Y raise, Lt, x  10      Shoulder Exercises: Sidelying   External Rotation Strengthening;Left;20 reps;Weights    External Rotation Weight (lbs) 5    External Rotation Limitations 2x10    ABduction Strengthening;Left;20 reps;Weights    ABduction Weight (lbs) 5    ABduction Limitations 2x10   elbow bent to 90 for 2nd set     Shoulder Exercises: Standing   Flexion Strengthening;Left;Weights;10 reps    Shoulder Flexion Weight (lbs) 5    Diagonals Strengthening;Left;Weights;20 reps    Diagonals Limitations power tower lift (10lb) and chop (15lb)      Shoulder Exercises: ROM/Strengthening   UBE (Upper Arm Bike) L2.5 3x3 fwd/bwd, PT present to discuss progress    [place]anger Lt flexion 10x5 sec holds    Lat Pull Limitations 35lb 2x10      Shoulder Exercises: Power Scientist, research (medical) (comment)    Row Limitations 55lb, 2x10    Other Power [employer] Exercises Lt chest press x 20 reps, 25lb      Modalities   Modalities Cryotherapy      Cryotherapy   Number Minutes Cryotherapy 10 Minutes    Cryotherapy Location  Shoulder   Lt   Type of Cryotherapy Ice pack      Manual Therapy   Manual Therapy Soft tissue mobilization    Soft tissue mobilization Addaday assisted, seated with Lt elbow propped on table at 90 deg, STM to posterior, middle and anterior Lt deltoid, bicep tendon, lat, teres minor                       PT Short Term Goals - 09/06/20 1359       PT SHORT TERM GOAL #1   Title Pt will be ind with initial HEP    Status Achieved      PT SHORT TERM GOAL #2   Title Pt will be able to perform work tasks with min exacerbation of pain    Status Achieved      PT SHORT TERM GOAL #3   Title Pt will improve sleep to waking no more than 1x/night due to pain    Status Achieved      PT SHORT TERM GOAL #4   Title Pt will achieve Lt shoulder elevation to at least 140 deg with min pain    Status Achieved               PT Long Term Goals - 09/16/20 1052       PT LONG TERM GOAL #1    Title Pt will be ind with advanced HEP and understand how to safely progress    Status On-going      PT LONG TERM GOAL #2   Title Pt will be able to complete work day and sleep through night with min or no pain      PT LONG TERM GOAL #3   Title Pt will demo A/ROM of Lt shoulder to within 10 deg of Rt shoulder with min pain    Status Achieved      PT LONG TERM GOAL #4   Title Pt will achieve Lt shoulder strength of at least 4+/5 tested in functional positions for pull, push, shoulder rotation, and overhead press with min or no pain    Status On-going      PT LONG TERM GOAL #5   Title Improve FOTO to at least 79% to demo improved function of Lt UE    Status On-going                   Plan - 09/16/20 1143     Clinical Impression Statement Pt states his shoulder gets stiff but improves with movement.  He is sleeping well at night.  He gets occassional twings of pain rated 5-6/10 when removing his backpack on the job but they don't last.  Overall he has 40-50% good days without pain occurrence at work.  He continues to display improving strength of RC and scapular stabilizers.  Addaday assisted STM and stretching used to address tightness around shoulder.  Pt reported fatigue and tightness vs pain with ther ex today demo'ing improving tolerance and strength.    PT Frequency 2x / week    PT Duration 6 weeks    PT Treatment/Interventions ADLs/Self Care Home Management;Cryotherapy;Electrical Stimulation;Iontophoresis 4mg /ml Dexamethasone;Functional mobility training;Therapeutic activities;Therapeutic exercise;Neuromuscular re-education;Manual techniques;Patient/family education;Passive range of motion;Dry needling;Joint Manipulations;Taping    PT Next Visit Plan continue shoulder stretching, Addaday assisted STM, strength of RC and scapular stabilizers and functional strength for push, pull, carry, lift    PT Home Exercise Plan Access Code: [email or web address] (blue band for shoulder ther  ex)     Consulted and Agree with Plan of Care Patient             Patient will benefit from skilled therapeutic intervention in order to improve the following deficits and impairments:     Visit Diagnosis: Acute pain of left shoulder  Muscle weakness (generalized)  Abnormal posture     Problem List Patient Active Problem List   Diagnosis Date Noted   Morbid obesity (HCC) 11/11/2017   Asthma, intermittent, uncomplicated 04/12/2016   Obesity, pediatric, BMI 95th to 98th percentile for age 66/05/2016   Allergic rhinitis 04/12/2016    Doyce Para Linzy Darling, PT 09/16/2020, 11:47 AM  [facility] [address], [address] [place], [place], [address] Phone: [phone]   Fax:  [phone]  Name: Benjamin Berry MRN: [id number] Date of Birth: 2000/04/08

## 2020-09-20 ENCOUNTER — Ambulatory Visit: Payer: Worker's Compensation | Admitting: Physical Therapy

## 2020-09-20 ENCOUNTER — Other Ambulatory Visit: Payer: Self-pay

## 2020-09-20 ENCOUNTER — Encounter: Payer: Self-pay | Admitting: Physical Therapy

## 2020-09-20 DIAGNOSIS — M25512 Pain in left shoulder: Secondary | ICD-10-CM

## 2020-09-20 DIAGNOSIS — R293 Abnormal posture: Secondary | ICD-10-CM | POA: Diagnosis not present

## 2020-09-20 DIAGNOSIS — M6281 Muscle weakness (generalized): Secondary | ICD-10-CM

## 2020-09-20 NOTE — Therapy (Signed)
[facility] [address], [address] [place], [place], [address] Phone: [phone]   Fax:  [phone]  Physical Therapy Treatment  Patient Details  Name: Benjamin Berry MRN: [id number] Date of Birth: [birth year] Referring Provider (PT): [place], Betty G, MD   Encounter Date: 09/20/2020   PT End of Session - 09/20/20 1354     Visit Number 12    Date for PT Re-Evaluation 09/23/20    Authorization Type [facility], medical review after 25 visits    Authorization - Visit Number 12    Authorization - Number of Visits 25    PT Start Time 1356    PT Stop Time 1444    PT Time Calculation (min) 48 min    Activity Tolerance Patient tolerated treatment well    Behavior During Therapy [facility] for tasks assessed/performed             Past Medical History:  Diagnosis Date   Allergy    Asthma     History reviewed. No pertinent surgical history.  There were no vitals filed for this visit.                      [facility] Adult PT Treatment/Exercise - 09/20/20 0001       Exercises   Exercises Shoulder;Elbow      Elbow Exercises   Elbow Flexion Strengthening;Left;Standing;Bar weights/barbell;10 reps    Bar Weights/Barbell (Elbow Flexion) 5 lbs    Elbow Flexion Limitations curl to press    Elbow Extension Strengthening;Left;20 reps;Standing;Theraband    Theraband Level (Elbow Extension) Level 4 (Blue)      Shoulder Exercises: Standing   Horizontal ABduction Strengthening;Both;Theraband    Horizontal ABduction Limitations green loop around hands spreads arms at 90/90    External Rotation Strengthening;Left;Theraband;20 reps    Theraband Level (Shoulder External Rotation) Level 4 (Blue)    Flexion Strengthening;Left;10 reps;Weights    Shoulder Flexion Weight (lbs) 5    Extension Strengthening;Theraband;20 reps;Left    Theraband Level (Shoulder Extension) Level 4 (Blue)    Other Standing Exercises bent over fly 2lb  dumbbells 1x10      Shoulder Exercises: ROM/Strengthening   UBE (Upper Arm Bike) L2.5 3x3 fwd/bwd, PT present to discuss progress    Ranger Lt flexion 10x5 sec holds      Shoulder Exercises: Power [employer] 20 reps    Row Limitations 55lb, bil and Lt single arm alt x 20 reps      Modalities   Modalities Cryotherapy      Cryotherapy   Number Minutes Cryotherapy 10 Minutes    Cryotherapy Location Shoulder    Type of Cryotherapy Ice pack      Manual Therapy   Manual Therapy Soft tissue mobilization    Soft tissue mobilization Addaday assisted, seated with Lt elbow propped on table at 90 deg, STM to posterior, middle and anterior Lt deltoid, bicep tendon, lat, teres minor                       PT Short Term Goals - 09/06/20 1359       PT SHORT TERM GOAL #1   Title Pt will be ind with initial HEP    Status Achieved      PT SHORT TERM GOAL #2   Title Pt will be able to perform work tasks with min exacerbation of pain    Status Achieved  PT SHORT TERM GOAL #3   Title Pt will improve sleep to waking no more than 1x/night due to pain    Status Achieved      PT SHORT TERM GOAL #4   Title Pt will achieve Lt shoulder elevation to at least 140 deg with min pain    Status Achieved               PT Long Term Goals - 09/16/20 1052       PT LONG TERM GOAL #1   Title Pt will be ind with advanced HEP and understand how to safely progress    Status On-going      PT LONG TERM GOAL #2   Title Pt will be able to complete work day and sleep through night with min or no pain      PT LONG TERM GOAL #3   Title Pt will demo A/ROM of Lt shoulder to within 10 deg of Rt shoulder with min pain    Status Achieved      PT LONG TERM GOAL #4   Title Pt will achieve Lt shoulder strength of at least 4+/5 tested in functional positions for pull, push, shoulder rotation, and overhead press with min or no pain    Status On-going      PT LONG TERM GOAL #5   Title Improve  FOTO to at least 79% to demo improved function of Lt UE    Status On-going                   Plan - 09/20/20 1400     Clinical Impression Statement Pt continues to display improved strength and control of Lt shoulder girdle with all ther ex.  He has full ROM and is able to participate fully with increased reps and resistance for all shoulder and elbow muscle groups.  He had a painfree work day today and only one episode of slight pain over the weekend.  PT and Pt discussed whether he felt ready to d/c at next visit's re-eval and given Pt's ind with HEP and access to a gym, Pt feels he will be ready to manage on his own after this week.    PT Frequency 2x / week    PT Duration 6 weeks    PT Treatment/Interventions ADLs/Self Care Home Management;Cryotherapy;Electrical Stimulation;Iontophoresis 4mg /ml Dexamethasone;Functional mobility training;Therapeutic activities;Therapeutic exercise;Neuromuscular re-education;Manual techniques;Patient/family education;Passive range of motion;Dry needling;Joint Manipulations;Taping    PT Next Visit Plan ERO next visit    PT Home Exercise Plan Access Code: [email or web address] (blue band for shoulder ther ex)    Consulted and Agree with Plan of Care Patient             Patient will benefit from skilled therapeutic intervention in order to improve the following deficits and impairments:     Visit Diagnosis: Acute pain of left shoulder  Muscle weakness (generalized)  Abnormal posture     Problem List Patient Active Problem List   Diagnosis Date Noted   Morbid obesity (HCC) 11/11/2017   Asthma, intermittent, uncomplicated 04/12/2016   Obesity, pediatric, BMI 95th to 98th percentile for age 56/05/2016   Allergic rhinitis 04/12/2016    06/12/2016, PT 09/20/20 2:41 PM   [facility] [address] [place], [place], [place] Phone: [phone]   Fax:  [phone]  Name: Benjamin Berry MRN: Dan Humphreys Date of Birth: 01/12/2000

## 2020-09-23 ENCOUNTER — Ambulatory Visit: Payer: Worker's Compensation | Admitting: Physical Therapy

## 2020-09-23 ENCOUNTER — Encounter: Payer: Self-pay | Admitting: Physical Therapy

## 2020-09-23 ENCOUNTER — Other Ambulatory Visit: Payer: Self-pay

## 2020-09-23 DIAGNOSIS — M25512 Pain in left shoulder: Secondary | ICD-10-CM | POA: Diagnosis not present

## 2020-09-23 DIAGNOSIS — M6281 Muscle weakness (generalized): Secondary | ICD-10-CM | POA: Diagnosis not present

## 2020-09-23 DIAGNOSIS — R293 Abnormal posture: Secondary | ICD-10-CM | POA: Diagnosis not present

## 2020-09-23 NOTE — Therapy (Signed)
[facility] [address] [place], [place], [address] Phone: [phone]   Fax:  [phone]  Physical Therapy Treatment  Patient Details  Name: Benjamin Berry MRN: [id number] Date of Birth: 2000-01-22 Referring Provider (PT): [place], Betty G, MD   Encounter Date: 09/23/2020   PT End of Session - 09/23/20 1122     Visit Number 13    Date for PT Re-Evaluation 09/23/20    Authorization Type [facility], medical review after 25 visits    Authorization - Visit Number 13    Authorization - Number of Visits 25    PT Start Time 1100    PT Stop Time 1151    PT Time Calculation (min) 51 min    Activity Tolerance Patient tolerated treatment well    Behavior During Therapy [facility] for tasks assessed/performed             Past Medical History:  Diagnosis Date   Allergy    Asthma     History reviewed. No pertinent surgical history.  There were no vitals filed for this visit.   Subjective Assessment - 09/23/20 1104     Subjective I had a really good work day for the shoulder yesterday.  Overall I feel 85-90% better.  I can continue to work out at the gym and at home on my own from here.    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, wears a 60lb backpack    Leisure watch sports      Observation/Other Assessments   Focus on Therapeutic Outcomes (FOTO)  61%      AROM   Overall AROM Comments Lt shoulder ROM symmetrical to Rt, WNL, slight pain end range ext and functional IR      Strength   Strength Assessment Site Shoulder    Right/Left Shoulder Left    Left Shoulder Flexion 5/5    Left Shoulder Extension 5/5    Left Shoulder ABduction 4+/5    Left Shoulder Internal Rotation 5/5    Left Shoulder External Rotation 4+/5      Palpation   Palpation comment slight tenderness along posterior and anterior deltoid, bicep tendon on Lt      Neer Impingement test    Findings Negative    Side Left      Anterior Apprehension test   Findings Negative    Side Left                           [facility] Adult PT Treatment/Exercise - 09/23/20 0001  Exercises   Exercises Shoulder;Elbow      Elbow Exercises   Elbow Flexion Strengthening;Left;10 reps    Bar Weights/Barbell (Elbow Flexion) 5 lbs    Elbow Extension Strengthening;Left;15 reps;Standing;Theraband    Theraband Level (Elbow Extension) Level 4 (Blue)      Shoulder Exercises: Standing   Horizontal ABduction Limitations green loop around hands spreads arms at 90/90   5x5 sec   Flexion Strengthening;Left;10 reps;Weights    Shoulder Flexion Weight (lbs) 3    Diagonals Weights;Strengthening;Left;15 reps    Diagonals Weight (lbs) 3    Diagonals Limitations d2 flexion    Other Standing Exercises bent over fly 2lb dumbbells 1x10, clock drill on wall green loop x 10 Lt only    Other Standing Exercises push ups hands on red ball on wall x 10, then 20 circles Lt UE at 90 deg each way      Shoulder Exercises: ROM/Strengthening   UBE (Upper Arm Bike) L2.5 3x3 fwd/bwd, PT present to discuss progress    Ranger Lt flexion 10x5 sec holds      Shoulder Exercises: Power [employer], community 15 reps    Extension Limitations Lt, from  flexion to neutral    Other Power [employer] Exercises lat pulldowns 2x15 35lb      Cryotherapy   Number Minutes Cryotherapy 10 Minutes    Cryotherapy Location Shoulder    Type of Cryotherapy Ice pack      Manual Therapy   Manual Therapy Soft tissue mobilization    Soft tissue mobilization Addaday assisted, seated with Lt elbow propped on table at 90 deg, STM to posterior, middle and anterior Lt deltoid, bicep tendon, lat, teres minor                       PT Short Term Goals - 09/06/20 1359       PT SHORT TERM GOAL #1   Title Pt will be ind with initial HEP    Status Achieved      PT SHORT TERM GOAL #2   Title Pt will be able to perform work tasks with min exacerbation of pain    Status Achieved      PT SHORT TERM GOAL #3   Title Pt will improve sleep to waking no more than 1x/night due to pain    Status Achieved      PT SHORT TERM GOAL #4   Title Pt will achieve Lt shoulder elevation to at least 140 deg with min pain    Status Achieved               PT Long Term Goals - 09/23/20 1105       PT LONG TERM GOAL #1   Title Pt will be ind with advanced HEP and understand how to safely progress    Status Achieved      PT LONG TERM GOAL #2   Title Pt will be able to complete work day and sleep through night with min or no pain    Status Achieved      PT LONG TERM GOAL #3   Title Pt will demo A/ROM of Lt shoulder to within 10 deg of Rt shoulder with min pain    Status Achieved      PT LONG TERM GOAL #4   Title Pt will achieve Lt shoulder strength of at least 4+/5 tested in functional positions for pull, push, shoulder rotation, and overhead press with min or no  pain    Status Achieved      PT LONG TERM GOAL #5   Title Improve FOTO to at least 79% to demo improved function of Lt UE    Baseline 61%    Status Partially Met                   Plan - 09/23/20 1123     Clinical Impression Statement Pt has much improved pain, ROM and strength of  Lt shoulder following a fall.  Pt reports 85-90% improvement in Lt shoulder pain with all daily activities and is now sleeping without pain.  FOTO score improved from 50% to 61% demo'ing improved function of shoulder but not quite reaching LTG.  ROM is symmetrical to Rt shoulder and WNL.  He has end range mild pain in full Lt shoulder extension.  Shoulder strength is 5/5 with exception of abduction which is 4+/5 with mild pain on testing.  Pt with much improved scapular strength, mechanics and control.  He has had numerous days of work either without or with mild shoulder pain.  He is confident with his HEP and understands that continued focus on strength with improve remaining pain.  D/C to HEP at this time.    PT Frequency 2x / week    PT Duration 6 weeks    PT Treatment/Interventions ADLs/Self Care Home Management;Cryotherapy;Electrical Stimulation;Iontophoresis 91m/ml Dexamethasone;Functional mobility training;Therapeutic activities;Therapeutic exercise;Neuromuscular re-education;Manual techniques;Patient/family education;Passive range of motion;Dry needling;Joint Manipulations;Taping    PT Next Visit Plan d/c to HEP    PT Home Exercise Plan Access Code: [email or web address](blue band for shoulder ther ex)    Consulted and Agree with Plan of Care Patient             Patient will benefit from skilled therapeutic intervention in order to improve the following deficits and impairments:     Visit Diagnosis: Acute pain of left shoulder  Muscle weakness (generalized)  Abnormal posture     Problem List Patient Active Problem List   Diagnosis Date Noted   Morbid obesity ([place] 11/11/2017   Asthma, intermittent, uncomplicated 080/22/3361  Obesity, pediatric, BMI 95th to 98th percentile for age 82/05/2016   Allergic rhinitis 04/12/2016    PHYSICAL THERAPY DISCHARGE SUMMARY  Visits from Start of Care: 13  Current functional level related to goals / functional outcomes: See above   Remaining  deficits: See above  Education / Equipment: HEP  Patient agrees to discharge. Patient goals were met. Patient is being discharged due to meeting the stated rehab goals.  JVenetia NightBeuhring, PT 09/23/20 11:47 AM  [facility] [address] [place] [address]Phone: [phone]  Fax:  [phone] Name: Benjamin CORDNERMRN: [id number] of Birth: 3Jul 20, 2002

## 2020-09-27 ENCOUNTER — Encounter: Payer: 59 | Admitting: Physical Therapy

## 2020-09-30 ENCOUNTER — Encounter: Payer: 59 | Admitting: Physical Therapy

## 2020-10-04 ENCOUNTER — Encounter: Payer: 59 | Admitting: Physical Therapy

## 2020-10-28 ENCOUNTER — Other Ambulatory Visit (HOSPITAL_COMMUNITY): Payer: Self-pay

## 2020-10-28 MED FILL — Montelukast Sodium Tab 10 MG (Base Equiv): ORAL | 90 days supply | Qty: 90 | Fill #2 | Status: AC

## 2020-10-28 MED FILL — Budesonide-Formoterol Fumarate Dihyd Aerosol 160-4.5 MCG/ACT: RESPIRATORY_TRACT | 30 days supply | Qty: 10.2 | Fill #2 | Status: AC

## 2021-01-09 ENCOUNTER — Other Ambulatory Visit: Payer: Self-pay | Admitting: Family Medicine

## 2021-01-09 ENCOUNTER — Other Ambulatory Visit (HOSPITAL_COMMUNITY): Payer: Self-pay

## 2021-01-09 DIAGNOSIS — J309 Allergic rhinitis, unspecified: Secondary | ICD-10-CM

## 2021-01-09 DIAGNOSIS — J4520 Mild intermittent asthma, uncomplicated: Secondary | ICD-10-CM

## 2021-01-10 ENCOUNTER — Other Ambulatory Visit (HOSPITAL_COMMUNITY): Payer: Self-pay

## 2021-01-10 MED ORDER — MONTELUKAST SODIUM 10 MG PO TABS
10.0000 mg | ORAL_TABLET | Freq: Every day | ORAL | 2 refills | Status: DC
  Filled 2021-01-10: qty 90, 90d supply, fill #0
  Filled 2021-03-23 – 2021-04-07 (×2): qty 90, 90d supply, fill #1
  Filled 2021-06-09 – 2021-08-02 (×3): qty 90, 90d supply, fill #2

## 2021-01-10 MED ORDER — BUDESONIDE-FORMOTEROL FUMARATE 160-4.5 MCG/ACT IN AERO
2.0000 | INHALATION_SPRAY | Freq: Two times a day (BID) | RESPIRATORY_TRACT | 3 refills | Status: DC
  Filled 2021-01-10: qty 10.2, 30d supply, fill #0
  Filled 2021-03-23: qty 10.2, 30d supply, fill #1
  Filled 2021-06-09: qty 10.2, 30d supply, fill #2
  Filled 2021-08-02: qty 10.2, 30d supply, fill #3

## 2021-03-23 ENCOUNTER — Other Ambulatory Visit (HOSPITAL_COMMUNITY): Payer: Self-pay

## 2021-04-07 ENCOUNTER — Other Ambulatory Visit (HOSPITAL_COMMUNITY): Payer: Self-pay

## 2021-06-09 ENCOUNTER — Other Ambulatory Visit (HOSPITAL_COMMUNITY): Payer: Self-pay

## 2021-06-22 ENCOUNTER — Other Ambulatory Visit (HOSPITAL_COMMUNITY): Payer: Self-pay

## 2021-08-02 ENCOUNTER — Other Ambulatory Visit (HOSPITAL_COMMUNITY): Payer: Self-pay

## 2021-09-08 ENCOUNTER — Other Ambulatory Visit: Payer: Self-pay | Admitting: Family Medicine

## 2021-09-08 ENCOUNTER — Other Ambulatory Visit (HOSPITAL_COMMUNITY): Payer: Self-pay

## 2021-09-08 DIAGNOSIS — J4520 Mild intermittent asthma, uncomplicated: Secondary | ICD-10-CM

## 2021-09-08 DIAGNOSIS — J309 Allergic rhinitis, unspecified: Secondary | ICD-10-CM

## 2021-09-08 MED ORDER — MONTELUKAST SODIUM 10 MG PO TABS
10.0000 mg | ORAL_TABLET | Freq: Every day | ORAL | 0 refills | Status: DC
  Filled 2021-09-08: qty 30, 30d supply, fill #0

## 2021-09-08 MED ORDER — BUDESONIDE-FORMOTEROL FUMARATE 160-4.5 MCG/ACT IN AERO
2.0000 | INHALATION_SPRAY | Freq: Two times a day (BID) | RESPIRATORY_TRACT | 0 refills | Status: DC
  Filled 2021-09-08: qty 10.2, 30d supply, fill #0

## 2021-09-08 NOTE — Telephone Encounter (Signed)
Pt has an appt with dr Swaziland on 09-22-2021 and would like a refill on albuterol (PROVENTIL HFA;VENTOLIN HFA) 108 (90 Base) MCG/ACT inhaler and montelukast (SINGULAIR) 10 MG tablet  [facility] Phone:  [phone]  Fax:  [phone]

## 2021-09-20 NOTE — Progress Notes (Unsigned)
HPI: Benjamin Berry is a 21 y.o. male, who is here today for follow up. He was last seen on 08/09/20 for shoulder pain, resolved.  Asthma and allergic rhinitis: He is on Singulair 10 mg daily,Zyrtec 10 mg daily,Symbicort 160-4.5 mcg bid,and Albuterol inh as needed. He does not need Albuterol inh frequently. Symptoms worse during spring.  He has not had nasal congestion,cough,wheezing,or SOB. Severe reaction with bee sting, has not needed his epi pen in years.  He has not been consistent with regular exercise, resumed wt lifting recently.  He eats home made meals, when his mother cannot cook he eats fast food, 1-2 per week. He has been working 2 jobs, gets home late at night, Earlean Polka is eating bigger portions when he gets home.  Lab Results  Component Value Date   CHOL 153 02/24/2020   HDL 48.90 02/24/2020   LDLCALC 88 02/24/2020   TRIG 76.0 02/24/2020   CHOLHDL 3 02/24/2020   Lab Results  Component Value Date   HGBA1C 5.6 02/24/2020   Review of Systems  Constitutional:  Negative for activity change, appetite change, fatigue and fever.  HENT:  Negative for nosebleeds, rhinorrhea and sore throat.   Eyes:  Negative for redness and itching.  Cardiovascular:  Negative for chest pain, palpitations and leg swelling.  Gastrointestinal:  Negative for abdominal pain, nausea and vomiting.  Endocrine: Negative for polydipsia, polyphagia and polyuria.  Allergic/Immunologic: Positive for environmental allergies.  Neurological:  Negative for syncope, weakness and headaches.  Rest see pertinent positives and negatives per HPI.  Current Outpatient Medications on File Prior to Visit  Medication Sig Dispense Refill   cetirizine (ZYRTEC) 10 MG tablet Take 10 mg by mouth daily.     No current facility-administered medications on file prior to visit.   Past Medical History:  Diagnosis Date   Allergy    Asthma    Allergies  Allergen Reactions   Bee Venom    Amoxicillin Other  (See Comments)    Reaction unknown   Social History   Socioeconomic History   Marital status: Single    Spouse name: Not on file   Number of children: Not on file   Years of education: Not on file   Highest education level: Not on file  Occupational History   Not on file  Tobacco Use   Smoking status: Never   Smokeless tobacco: Never  Vaping Use   Vaping Use: Never used  Substance and Sexual Activity   Alcohol use: No   Drug use: No   Sexual activity: Yes    Partners: Female  Other Topics Concern   Not on file  Social History Narrative   Not on file   Social Determinants of Health   Financial Resource Strain: Not on file  Food Insecurity: Not on file  Transportation Needs: Not on file  Physical Activity: Sufficiently Active (11/11/2017)   Exercise Vital Sign    Days of Exercise per Week: 3 days    Minutes of Exercise per Session: 60 min  Stress: No Stress Concern Present (11/11/2017)   [employer] of Occupational Health - Occupational Stress Questionnaire    Feeling of Stress : Not at all  Social Connections: Not on file   Vitals:   09/22/21 1601  BP: 128/80  Pulse: 85  Resp: 16  SpO2: 98%   Wt Readings from Last 3 Encounters:  09/22/21 (!) 326 lb 8 oz (148.1 kg)  08/09/20 (!) 327 lb (148.3 kg)  02/24/20 (!) 333  lb 6 oz (151.2 kg)  Body mass index is 46.05 kg/m.  Physical Exam Vitals and nursing note reviewed.  Constitutional:      General: He is not in acute distress.    Appearance: He is well-developed and well-groomed.  HENT:     Head: Normocephalic and atraumatic.     Nose: No rhinorrhea.     Right Sinus: No maxillary sinus tenderness or frontal sinus tenderness.     Left Sinus: No maxillary sinus tenderness or frontal sinus tenderness.     Mouth/Throat:     Mouth: Mucous membranes are moist.     Pharynx: Oropharynx is clear.  Eyes:     Conjunctiva/sclera: Conjunctivae normal.  Cardiovascular:     Rate and Rhythm: Normal rate and regular  rhythm.     Heart sounds: No murmur heard. Pulmonary:     Effort: Pulmonary effort is normal. No respiratory distress.     Breath sounds: Normal breath sounds.  Abdominal:     Palpations: Abdomen is soft. There is no hepatomegaly or mass.     Tenderness: There is no abdominal tenderness.  Lymphadenopathy:     Cervical: No cervical adenopathy.  Skin:    General: Skin is warm.     Findings: No erythema or rash.  Neurological:     Mental Status: He is alert and oriented to person, place, and time.     Cranial Nerves: No cranial nerve deficit.     Gait: Gait normal.  Psychiatric:        Mood and Affect: Mood and affect normal.   ASSESSMENT AND PLAN:  Leonel was seen today for follow-up.  Diagnoses and all orders for this visit:  Asthma, intermittent, uncomplicated Problem is well controlled. Continue same dose of Symbicort inh. Recommend taking breaks from Singulair during summer and winter as tolerated. Wt loss will help.  -     montelukast (SINGULAIR) 10 MG tablet; Take 1 tablet (10 mg total) by mouth at bedtime. -     budesonide-formoterol (SYMBICORT) 160-4.5 MCG/ACT inhaler; Inhale 2 puffs into the lungs 2 (two) times daily. -     albuterol (VENTOLIN HFA) 108 (90 Base) MCG/ACT inhaler; Inhale 2 puffs into the lungs every 6 (six) hours as needed for wheezing or shortness of breath.  Morbid obesity (HCC) His wt stable since 08/2020. We discussed benefits of wt loss as well as adverse effects of obesity. Consistency with healthy diet and physical activity encouraged.  Allergic rhinitis, unspecified seasonality, unspecified trigger Well controlled. Continue Zyrtec 10 mg daily prn and Singulair during times of the year he is more symptomatic, Spring.  -     montelukast (SINGULAIR) 10 MG tablet; Take 1 tablet (10 mg total) by mouth at bedtime.  Bee sting allergy -     EPINEPHrine 0.3 mg/0.3 mL IJ SOAJ injection; Inject 0.3 mg into the muscle as needed for  anaphylaxis.  Return in about 1 year (around 09/23/2022) for CPE.  Vardaan Depascale G. Swaziland, MD  [facility].

## 2021-09-22 ENCOUNTER — Other Ambulatory Visit (HOSPITAL_COMMUNITY): Payer: Self-pay

## 2021-09-22 ENCOUNTER — Encounter: Payer: Self-pay | Admitting: Family Medicine

## 2021-09-22 ENCOUNTER — Ambulatory Visit: Payer: 59 | Admitting: Family Medicine

## 2021-09-22 VITALS — BP 128/80 | HR 85 | Resp 16 | Ht 70.6 in | Wt 326.5 lb

## 2021-09-22 DIAGNOSIS — J4520 Mild intermittent asthma, uncomplicated: Secondary | ICD-10-CM

## 2021-09-22 DIAGNOSIS — Z91030 Bee allergy status: Secondary | ICD-10-CM

## 2021-09-22 DIAGNOSIS — J309 Allergic rhinitis, unspecified: Secondary | ICD-10-CM

## 2021-09-22 MED ORDER — MONTELUKAST SODIUM 10 MG PO TABS
10.0000 mg | ORAL_TABLET | Freq: Every day | ORAL | 2 refills | Status: DC
  Filled 2021-09-22: qty 90, 90d supply, fill #0
  Filled 2021-10-19: qty 14, 14d supply, fill #0
  Filled 2021-10-19: qty 76, 76d supply, fill #0
  Filled 2022-01-25: qty 90, 90d supply, fill #1
  Filled 2022-04-30: qty 90, 90d supply, fill #2

## 2021-09-22 MED ORDER — BUDESONIDE-FORMOTEROL FUMARATE 160-4.5 MCG/ACT IN AERO
2.0000 | INHALATION_SPRAY | Freq: Two times a day (BID) | RESPIRATORY_TRACT | 6 refills | Status: DC
  Filled 2021-09-22 – 2021-11-29 (×3): qty 10.2, 30d supply, fill #0
  Filled 2022-01-25: qty 10.2, 30d supply, fill #1
  Filled 2022-03-09 – 2022-03-26 (×2): qty 10.2, 30d supply, fill #2
  Filled 2022-05-25 (×3): qty 10.2, 30d supply, fill #3
  Filled 2022-07-23: qty 10.2, 30d supply, fill #4
  Filled 2022-09-20: qty 10.2, 30d supply, fill #5

## 2021-09-22 MED ORDER — EPINEPHRINE 0.3 MG/0.3ML IJ SOAJ
0.3000 mg | INTRAMUSCULAR | 1 refills | Status: DC | PRN
  Filled 2021-09-22: qty 2, 30d supply, fill #0
  Filled 2022-07-23: qty 2, 5d supply, fill #0

## 2021-09-22 MED ORDER — ALBUTEROL SULFATE HFA 108 (90 BASE) MCG/ACT IN AERS
2.0000 | INHALATION_SPRAY | Freq: Four times a day (QID) | RESPIRATORY_TRACT | 2 refills | Status: DC | PRN
  Filled 2021-09-22: qty 6.7, 25d supply, fill #0
  Filled 2022-09-20: qty 6.7, 20d supply, fill #0

## 2021-09-22 NOTE — Patient Instructions (Signed)
A few things to remember from today's visit:  Asthma, intermittent, uncomplicated - Plan: montelukast (SINGULAIR) 10 MG tablet, albuterol (VENTOLIN HFA) 108 (90 Base) MCG/ACT inhaler  Allergic rhinitis, unspecified seasonality, unspecified trigger - Plan: montelukast (SINGULAIR) 10 MG tablet  Bee sting allergy - Plan: EPINEPHrine 0.3 mg/0.3 mL IJ SOAJ injection  If you need refills for medications you take chronically, please call your pharmacy. Do not use My Chart to request refills or for acute issues that need immediate attention. If you send a my chart message, it may take a few days to be addressed, specially if I am not in the office.  Please be sure medication list is accurate. If a new problem present, please set up appointment sooner than planned today.  You can try to take breaks from Singulair during summer and winter, if tolerated. Try to decrease calories intake and engage in aerobic exercise, 150 min per week.

## 2021-10-02 NOTE — Progress Notes (Signed)
ACUTE VISIT Chief Complaint  Patient presents with   Exposure to STD    Possible chlamydia exposure   HPI: Benjamin Berry is a 21 y.o. male, who is here today requesting STI testing. His girlfriend's ex-boyfriend was Dx'ed with chlamydia infection.  According to patient, she is not having symptoms, she was screened and labs came back negative.  He "always" wears condoms but concerned because has performed oral sex. He denies fever,chills,oral lesions,sore throat, urethral discharge,dysuria,or genital lesions. No hx of STD's. HPV vaccination reported as completed.  Review of Systems  Constitutional:  Negative for activity change, appetite change and fatigue.  Eyes:  Negative for discharge and redness.  Respiratory:  Negative for cough, shortness of breath and wheezing.   Gastrointestinal:  Negative for abdominal pain, diarrhea, nausea and vomiting.  Genitourinary:  Negative for decreased urine volume, frequency and hematuria.  Musculoskeletal:  Negative for arthralgias and joint swelling.  Skin:  Negative for rash.  Neurological:  Negative for weakness and headaches.  Hematological:  Negative for adenopathy. Does not bruise/bleed easily.  Rest see pertinent positives and negatives per HPI.  Current Outpatient Medications on File Prior to Visit  Medication Sig Dispense Refill   albuterol (VENTOLIN HFA) 108 (90 Base) MCG/ACT inhaler Inhale 2 puffs into the lungs every 6 (six) hours as needed for wheezing or shortness of breath. 6.7 g 2   budesonide-formoterol (SYMBICORT) 160-4.5 MCG/ACT inhaler Inhale 2 puffs into the lungs 2 (two) times daily. 10.2 g 6   cetirizine (ZYRTEC) 10 MG tablet Take 10 mg by mouth daily.     EPINEPHrine 0.3 mg/0.3 mL IJ SOAJ injection Inject 0.3 mg into the muscle as needed for anaphylaxis. 2 each 1   montelukast (SINGULAIR) 10 MG tablet Take 1 tablet (10 mg total) by mouth at bedtime. 90 tablet 2   No current facility-administered medications on file  prior to visit.   Past Medical History:  Diagnosis Date   Allergy    Asthma    Allergies  Allergen Reactions   Bee Venom    Amoxicillin Other (See Comments)    Reaction unknown   Social History   Socioeconomic History   Marital status: Single    Spouse name: Not on file   Number of children: Not on file   Years of education: Not on file   Highest education level: Not on file  Occupational History   Not on file  Tobacco Use   Smoking status: Never   Smokeless tobacco: Never  Vaping Use   Vaping Use: Never used  Substance and Sexual Activity   Alcohol use: No   Drug use: No   Sexual activity: Yes    Partners: Female  Other Topics Concern   Not on file  Social History Narrative   Not on file   Social Determinants of Health   Financial Resource Strain: Not on file  Food Insecurity: Not on file  Transportation Needs: Not on file  Physical Activity: Sufficiently Active (11/11/2017)   Exercise Vital Sign    Days of Exercise per Week: 3 days    Minutes of Exercise per Session: 60 min  Stress: No Stress Concern Present (11/11/2017)   [place]    Feeling of Stress : Not at all  Social Connections: Not on file   Vitals:   10/03/21 1544  BP: 128/80  Pulse: 90  Resp: 16  SpO2: 97%   Body mass index is 45.9 kg/m.  Physical Exam Vitals and nursing note reviewed.  Constitutional:      General: He is not in acute distress.    Appearance: He is well-developed and well-groomed.  HENT:     Head: Normocephalic and atraumatic.     Mouth/Throat:     Mouth: Mucous membranes are moist.     Pharynx: Oropharynx is clear. No oropharyngeal exudate or posterior oropharyngeal erythema.  Eyes:     Conjunctiva/sclera: Conjunctivae normal.  Pulmonary:     Effort: Pulmonary effort is normal. No respiratory distress.  Lymphadenopathy:     Cervical: No cervical adenopathy.  Skin:    General: Skin is warm.      Findings: No erythema or rash.  Neurological:     General: No focal deficit present.     Mental Status: He is oriented to person, place, and time.     Cranial Nerves: No cranial nerve deficit.     Gait: Gait normal.  Psychiatric:        Mood and Affect: Mood and affect normal.   ASSESSMENT AND PLAN:  Benjamin Berry was seen today for exposure to std.  Diagnoses and all orders for this visit:  Screen for STD (sexually transmitted disease) -     HIV Antibody (routine testing w rflx); Future -     RPR; Future -     Urine cytology ancillary only; Future  Educated about STD prevention and safe sex practices. It does not seem like it was a direct STI exposure and reporting that girlfriend is asymptomatic and had recently be screened, negative. I do not think throat swab is needed at this time. Further recommendations will be given according to lab results.  Return if symptoms worsen or fail to improve.  Marcellene Shivley G. [place], MD  [facility]. [place] office.

## 2021-10-03 ENCOUNTER — Other Ambulatory Visit (HOSPITAL_COMMUNITY)
Admission: RE | Admit: 2021-10-03 | Discharge: 2021-10-03 | Disposition: A | Discharge status: Home / Self Care | Payer: 59 | Source: Ambulatory Visit | Attending: Family Medicine | Admitting: Family Medicine

## 2021-10-03 ENCOUNTER — Ambulatory Visit (INDEPENDENT_AMBULATORY_CARE_PROVIDER_SITE_OTHER): Payer: 59 | Admitting: Family Medicine

## 2021-10-03 ENCOUNTER — Encounter: Payer: Self-pay | Admitting: Family Medicine

## 2021-10-03 ENCOUNTER — Other Ambulatory Visit (HOSPITAL_COMMUNITY): Payer: Self-pay

## 2021-10-03 VITALS — BP 128/80 | HR 90 | Resp 16 | Ht 70.6 in | Wt 325.4 lb

## 2021-10-03 DIAGNOSIS — Z113 Encounter for screening for infections with a predominantly sexual mode of transmission: Secondary | ICD-10-CM | POA: Insufficient documentation

## 2021-10-03 NOTE — Patient Instructions (Signed)
A few things to remember from today's visit:  Screen for STD (sexually transmitted disease) - Plan: HIV Antibody (routine testing w rflx), RPR, Urine cytology ancillary only  I do not think you need further treatment at this time. Continue wearing condoms all the time.  If you need refills for medications you take chronically, please call your pharmacy. Do not use My Chart to request refills or for acute issues that need immediate attention. If you send a my chart message, it may take a few days to be addressed, specially if I am not in the office.  Please be sure medication list is accurate. If a new problem present, please set up appointment sooner than planned today.

## 2021-10-04 ENCOUNTER — Other Ambulatory Visit (HOSPITAL_COMMUNITY): Payer: Self-pay

## 2021-10-04 LAB — RPR: RPR Ser Ql: NONREACTIVE

## 2021-10-04 LAB — HIV ANTIBODY (ROUTINE TESTING W REFLEX): HIV 1&2 Ab, 4th Generation: NONREACTIVE

## 2021-10-05 LAB — URINE CYTOLOGY ANCILLARY ONLY
Chlamydia: NEGATIVE
Comment: NEGATIVE
Comment: NEGATIVE
Comment: NORMAL
Neisseria Gonorrhea: NEGATIVE
Trichomonas: NEGATIVE

## 2021-10-13 ENCOUNTER — Other Ambulatory Visit (HOSPITAL_COMMUNITY): Payer: Self-pay

## 2021-10-19 ENCOUNTER — Other Ambulatory Visit (HOSPITAL_COMMUNITY): Payer: Self-pay

## 2021-11-02 ENCOUNTER — Other Ambulatory Visit (HOSPITAL_COMMUNITY): Payer: Self-pay

## 2021-11-29 ENCOUNTER — Other Ambulatory Visit (HOSPITAL_COMMUNITY): Payer: Self-pay

## 2022-01-25 ENCOUNTER — Other Ambulatory Visit (HOSPITAL_COMMUNITY): Payer: Self-pay

## 2022-03-09 ENCOUNTER — Other Ambulatory Visit (HOSPITAL_COMMUNITY): Payer: Self-pay

## 2022-03-19 ENCOUNTER — Other Ambulatory Visit (HOSPITAL_COMMUNITY): Payer: Self-pay

## 2022-03-26 ENCOUNTER — Other Ambulatory Visit (HOSPITAL_COMMUNITY): Payer: Self-pay

## 2022-04-30 ENCOUNTER — Other Ambulatory Visit (HOSPITAL_COMMUNITY): Payer: Self-pay

## 2022-05-25 ENCOUNTER — Other Ambulatory Visit (HOSPITAL_COMMUNITY): Payer: Self-pay

## 2022-07-23 ENCOUNTER — Other Ambulatory Visit: Payer: Self-pay | Admitting: Family Medicine

## 2022-07-23 ENCOUNTER — Other Ambulatory Visit (HOSPITAL_COMMUNITY): Payer: Self-pay

## 2022-07-23 ENCOUNTER — Other Ambulatory Visit: Payer: Self-pay

## 2022-07-23 DIAGNOSIS — J4520 Mild intermittent asthma, uncomplicated: Secondary | ICD-10-CM

## 2022-07-23 DIAGNOSIS — J309 Allergic rhinitis, unspecified: Secondary | ICD-10-CM

## 2022-07-23 MED ORDER — MONTELUKAST SODIUM 10 MG PO TABS
10.0000 mg | ORAL_TABLET | Freq: Every day | ORAL | 0 refills | Status: DC
  Filled 2022-07-23: qty 90, 90d supply, fill #0

## 2022-09-20 ENCOUNTER — Other Ambulatory Visit (HOSPITAL_COMMUNITY): Payer: Self-pay

## 2022-11-05 ENCOUNTER — Other Ambulatory Visit: Payer: Self-pay | Admitting: Family Medicine

## 2022-11-05 ENCOUNTER — Other Ambulatory Visit (HOSPITAL_COMMUNITY): Payer: Self-pay

## 2022-11-05 DIAGNOSIS — J4520 Mild intermittent asthma, uncomplicated: Secondary | ICD-10-CM

## 2022-11-05 MED ORDER — BUDESONIDE-FORMOTEROL FUMARATE 160-4.5 MCG/ACT IN AERO
2.0000 | INHALATION_SPRAY | Freq: Two times a day (BID) | RESPIRATORY_TRACT | 0 refills | Status: DC
  Filled 2022-11-05: qty 10.2, 30d supply, fill #0

## 2022-11-08 ENCOUNTER — Other Ambulatory Visit (HOSPITAL_COMMUNITY): Payer: Self-pay

## 2022-12-27 ENCOUNTER — Other Ambulatory Visit (HOSPITAL_COMMUNITY): Payer: Self-pay

## 2022-12-27 ENCOUNTER — Other Ambulatory Visit: Payer: Self-pay | Admitting: Family Medicine

## 2022-12-27 DIAGNOSIS — J4520 Mild intermittent asthma, uncomplicated: Secondary | ICD-10-CM

## 2022-12-27 NOTE — Telephone Encounter (Signed)
Copied from CRM [id number]. Topic: Clinical - Medication Refill >> Dec 27, 2022  8:15 AM Pascal Lux wrote: Most Recent Primary Care Visit:  Provider: [place], BETTY G  Department: [facility]  Visit Type: OFFICE VISIT  Date: 10/03/2021  Medication: budesonide-formoterol (SYMBICORT)   Has the patient contacted their pharmacy? Yes (Agent: If no, request that the patient contact the pharmacy for the refill. If patient does not wish to contact the pharmacy document the reason why and proceed with request.) (Agent: If yes, when and what did the pharmacy advise?)  Is this the correct pharmacy for this prescription? Yes If no, delete pharmacy and type the correct one.  This is the patient's preferred pharmacy:  [facility] [address] [place] [address] Phone: [phone] Fax: [phone]   Has the prescription been filled recently? No  Is the patient out of the medication? Yes  Has the patient been seen for an appointment in the last year OR does the patient have an upcoming appointment? Yes  Can we respond through MyChart? No  Agent: Please be advised that Rx refills may take up to 3 business days. We ask that you follow-up with your pharmacy.

## 2022-12-28 ENCOUNTER — Other Ambulatory Visit (HOSPITAL_COMMUNITY): Payer: Self-pay

## 2023-01-07 ENCOUNTER — Other Ambulatory Visit (HOSPITAL_COMMUNITY): Payer: Self-pay

## 2023-01-07 ENCOUNTER — Telehealth: Payer: Self-pay

## 2023-01-07 DIAGNOSIS — J4520 Mild intermittent asthma, uncomplicated: Secondary | ICD-10-CM

## 2023-01-07 MED ORDER — BUDESONIDE-FORMOTEROL FUMARATE 160-4.5 MCG/ACT IN AERO
2.0000 | INHALATION_SPRAY | Freq: Two times a day (BID) | RESPIRATORY_TRACT | 0 refills | Status: DC
  Filled 2023-01-07: qty 10.2, 30d supply, fill #0

## 2023-01-07 NOTE — Telephone Encounter (Signed)
Rx done. 

## 2023-01-07 NOTE — Telephone Encounter (Signed)
Copied from CRM [id number]. Topic: Clinical - Medication Question >> Jan 03, 2023  3:55 PM Almira Coaster wrote: Reason for CRM: Per patient's mother pharmacy told them that we denied to send a prescription for budesonide-formoterol (SYMBICORT) 160-4.5 MCG/ACT inhaler without an appointment. I was able to schedule an appointment for Jan 6,2025; however, patient is completely out and wants to know it there is anything he can do to get a small dose in case of emergencies.

## 2023-01-14 ENCOUNTER — Other Ambulatory Visit (HOSPITAL_COMMUNITY): Payer: Self-pay

## 2023-01-14 ENCOUNTER — Other Ambulatory Visit: Payer: Self-pay | Admitting: Family Medicine

## 2023-01-14 ENCOUNTER — Ambulatory Visit: Payer: Commercial Managed Care - PPO | Admitting: Family Medicine

## 2023-01-14 ENCOUNTER — Telehealth: Payer: Commercial Managed Care - PPO | Admitting: Family Medicine

## 2023-01-14 DIAGNOSIS — J309 Allergic rhinitis, unspecified: Secondary | ICD-10-CM

## 2023-01-14 DIAGNOSIS — J4520 Mild intermittent asthma, uncomplicated: Secondary | ICD-10-CM

## 2023-01-14 MED ORDER — MONTELUKAST SODIUM 10 MG PO TABS
10.0000 mg | ORAL_TABLET | Freq: Every day | ORAL | 0 refills | Status: DC
  Filled 2023-01-14: qty 90, 90d supply, fill #0

## 2023-01-14 NOTE — Progress Notes (Signed)
Patient rescheduled for in person.

## 2023-01-17 ENCOUNTER — Other Ambulatory Visit (HOSPITAL_COMMUNITY): Payer: Self-pay

## 2023-01-18 ENCOUNTER — Other Ambulatory Visit (HOSPITAL_COMMUNITY): Payer: Self-pay

## 2023-01-18 NOTE — Progress Notes (Signed)
 HPI: BenjaminBenjamin Berry is a 23 y.o. male with a PMHx significant for asthma and allergic rhinits, who is here today for medication management.  Last seen on 10/03/2021 No new problems since his last visit.  Asthma:  He currently uses Albuterol  108 (90 base) MCG/ACT 2 puffs every 6 hours prn and Symbicort  160-4.5 MCG/ACT 2 puffs daily, as well as Singulair  10 mg. He rarely uses the albuterol , and says he takes the Singulair  every other day.  He reports his symptoms are worst in the spring.  He has not needed Albuterol  in several months, use it for DOE prn.  He also has an Epinephrine  pen 0.3 mg for an allergy to bee stings. He needs refills, he has not had this problem since his last visit.  Obesity: Exercise: He had restarted exercising, but has been inconsistent. He is now adjusting to a new schedule because he has gotten a new job.  Diet: He acknowledges he is occasionally eating too much. He tries to cook at home frequently.  Not interested in weight loss clinic.  Lab Results  Component Value Date   HGBA1C 5.6 02/24/2020  He reports occasional fatigue because he works two jobs. He denies difficulty breathing while sleeping or being told he stops breathing while sleeping.   Review of Systems  Constitutional:  Negative for activity change, appetite change and fever.  HENT:  Negative for nosebleeds, sore throat and trouble swallowing.   Eyes:  Negative for redness and visual disturbance.  Respiratory:  Negative for apnea, cough and wheezing.   Cardiovascular:  Negative for chest pain, palpitations and leg swelling.  Gastrointestinal:  Negative for abdominal pain, nausea and vomiting.  Endocrine: Negative for cold intolerance, heat intolerance, polydipsia, polyphagia and polyuria.  Genitourinary:  Negative for decreased urine volume and hematuria.  Skin:  Negative for rash.  Allergic/Immunologic: Positive for environmental allergies.  Neurological:  Negative for dizziness,  seizures, weakness, numbness and headaches.  Psychiatric/Behavioral:  Negative for confusion. The patient is not nervous/anxious.   See other pertinent positives and negatives in HPI.  Current Outpatient Medications on File Prior to Visit  Medication Sig Dispense Refill   cetirizine (ZYRTEC) 10 MG tablet Take 10 mg by mouth daily.     No current facility-administered medications on file prior to visit.   Past Medical History:  Diagnosis Date   Allergy    Asthma    Allergies  Allergen Reactions   Bee Venom    Amoxicillin Other (See Comments)    Reaction unknown   Social History   Socioeconomic History   Marital status: Single    Spouse name: Not on file   Number of children: Not on file   Years of education: Not on file   Highest education level: Not on file  Occupational History   Not on file  Tobacco Use   Smoking status: Never   Smokeless tobacco: Never  Vaping Use   Vaping status: Never Used  Substance and Sexual Activity   Alcohol use: No   Drug use: No   Sexual activity: Yes    Partners: Female  Other Topics Concern   Not on file  Social History Narrative   Not on file   Social Drivers of Health   Financial Resource Strain: Not on file  Food Insecurity: Not on file  Transportation Needs: Not on file  Physical Activity: Sufficiently Active (11/11/2017)   Exercise Vital Sign    Days of Exercise per Week: 3 days  Minutes of Exercise per Session: 60 min  Stress: No Stress Concern Present (11/11/2017)   [employer] of [employer]ational [employer] - Occupational Stress Questionnaire    Feeling of Stress : Not at all  Social Connections: Not on file   Vitals:   01/21/23 0759  BP: 128/80  Pulse: 94  Resp: 12  Temp: 98.5 F (36.9 C)  SpO2: 98%   Wt Readings from Last 3 Encounters:  01/21/23 (!) 350 lb (158.8 kg)  01/14/23 (!) 325 lb (147.4 kg)  10/03/21 (!) 325 lb 6 oz (147.6 kg)   Body mass index is 49.37 kg/m.  Physical Exam Vitals and nursing  note reviewed.  Constitutional:      General: He is not in acute distress.    Appearance: He is well-developed.  HENT:     Head: Normocephalic and atraumatic.     Nose:     Right Turbinates: Enlarged.     Left Turbinates: Enlarged.     Comments: Turbinates mildly enlarged.     Mouth/Throat:     Mouth: Mucous membranes are moist.     Pharynx: Oropharynx is clear. Uvula midline.  Eyes:     Conjunctiva/sclera: Conjunctivae normal.  Cardiovascular:     Rate and Rhythm: Normal rate and regular rhythm.     Heart sounds: No murmur heard. Pulmonary:     Effort: Pulmonary effort is normal. No respiratory distress.     Breath sounds: Normal breath sounds.  Abdominal:     Palpations: Abdomen is soft. There is no mass.     Tenderness: There is no abdominal tenderness.  Musculoskeletal:     Right lower leg: No edema.     Left lower leg: No edema.  Lymphadenopathy:     Cervical: No cervical adenopathy.  Skin:    General: Skin is warm.     Findings: No erythema or rash.  Neurological:     Mental Status: He is alert and oriented to person, place, and time.     Cranial Nerves: No cranial nerve deficit.     Gait: Gait normal.  Psychiatric:        Mood and Affect: Mood and affect normal.    ASSESSMENT AND PLAN:  Mr. Caraway was seen today for medication management.   Orders Placed This Encounter  Procedures   POC HgB A1c   Lab Results  Component Value Date   HGBA1C 5.5 01/21/2023   Class 3 severe obesity due to excess calories with serious comorbidity and body mass index (BMI) of 45.0 to 49.9 in adult [facility]) Assessment & Plan: He has gained some wt since his last visit in 09/2021. He understands the benefits of wt loss as well as adverse effects of obesity. Consistency with healthy diet and physical activity encouraged. He is not interested in establishing with [facility] Wellness clinic.  Orders: -     POCT glycosylated hemoglobin (Hb A1C)  Asthma, intermittent,  uncomplicated Assessment & Plan: Problem is well controlled. Continue Symbicort  160-4.5 mcg twice daily and albuterol  inhaler 2 puff every 4-6 hours as needed. Weight loss strongly recommended. Singulair  10 mg daily during Spring. F/U in a year as far as symptoms are stable.  Orders: -     Albuterol  Sulfate HFA; Inhale 2 puffs into the lungs every 6 (six) hours as needed for wheezing or shortness of breath.  Dispense: 6.7 g; Refill: 2 -     Budesonide -Formoterol  Fumarate; Inhale 2 puffs into the lungs 2 (two) times daily. Due for  follow up/physical.  Dispense: 10.2 g; Refill: 3 -     Montelukast  Sodium; Take 1 tablet (10 mg total) by mouth at bedtime. During Spring  Dispense: 90 tablet; Refill: 1  Bee sting allergy Assessment & Plan: Has not had episodes since his last visit. Epi pen sent.  Orders: -     EPINEPHrine ; Inject 0.3 mg into the muscle as needed for anaphylaxis.  Dispense: 2 each; Refill: 1  Allergic rhinitis, unspecified seasonality, unspecified trigger Assessment & Plan: Problem is stable. Continue Singulair  10 mg daily during Spring, when symptoms are more frequent. Zyrtec 10 mg daily as needed. Nasal saline irrigations as needed.  Orders: -     Montelukast  Sodium; Take 1 tablet (10 mg total) by mouth at bedtime. During Spring  Dispense: 90 tablet; Refill: 1   Return in about 1 year (around 01/21/2024) for chronic problems, CPE.  I, Leonce JINNY Ferry, acting as a scribe for Niasha Devins, MD., have documented all relevant documentation on the behalf of Calyssa Zobrist, MD, as directed by  Kately Graffam, MD while in the presence of Junita Kubota, MD.   I, Caitlinn Klinker, MD, have reviewed all documentation for this visit. The documentation on 01/21/23 for the exam, diagnosis, procedures, and orders are all accurate and complete.  Dequita Schleicher G. Karinne Schmader, MD  [facility] [place] office.

## 2023-01-21 ENCOUNTER — Ambulatory Visit: Payer: Commercial Managed Care - PPO | Admitting: Family Medicine

## 2023-01-21 ENCOUNTER — Other Ambulatory Visit (HOSPITAL_COMMUNITY): Payer: Self-pay

## 2023-01-21 ENCOUNTER — Encounter: Payer: Self-pay | Admitting: Family Medicine

## 2023-01-21 VITALS — BP 128/80 | HR 94 | Temp 98.5°F | Resp 12 | Ht 70.6 in | Wt 350.0 lb

## 2023-01-21 DIAGNOSIS — J309 Allergic rhinitis, unspecified: Secondary | ICD-10-CM | POA: Diagnosis not present

## 2023-01-21 DIAGNOSIS — E66813 Obesity, class 3: Secondary | ICD-10-CM | POA: Diagnosis not present

## 2023-01-21 DIAGNOSIS — J4520 Mild intermittent asthma, uncomplicated: Secondary | ICD-10-CM

## 2023-01-21 DIAGNOSIS — Z6841 Body Mass Index (BMI) 40.0 and over, adult: Secondary | ICD-10-CM

## 2023-01-21 DIAGNOSIS — Z91030 Bee allergy status: Secondary | ICD-10-CM | POA: Insufficient documentation

## 2023-01-21 LAB — POCT GLYCOSYLATED HEMOGLOBIN (HGB A1C): Hemoglobin A1C: 5.5 % (ref 4.0–5.6)

## 2023-01-21 MED ORDER — ALBUTEROL SULFATE HFA 108 (90 BASE) MCG/ACT IN AERS
2.0000 | INHALATION_SPRAY | Freq: Four times a day (QID) | RESPIRATORY_TRACT | 2 refills | Status: AC | PRN
  Filled 2023-01-21: qty 6.7, 25d supply, fill #0

## 2023-01-21 MED ORDER — MONTELUKAST SODIUM 10 MG PO TABS
10.0000 mg | ORAL_TABLET | Freq: Every day | ORAL | 1 refills | Status: DC
  Filled 2023-01-21 – 2023-05-03 (×2): qty 90, 90d supply, fill #0
  Filled 2023-09-05: qty 90, 90d supply, fill #1

## 2023-01-21 MED ORDER — EPINEPHRINE 0.3 MG/0.3ML IJ SOAJ
0.3000 mg | INTRAMUSCULAR | 1 refills | Status: AC | PRN
  Filled 2023-01-21: qty 2, 2d supply, fill #0

## 2023-01-21 MED ORDER — BUDESONIDE-FORMOTEROL FUMARATE 160-4.5 MCG/ACT IN AERO
2.0000 | INHALATION_SPRAY | Freq: Two times a day (BID) | RESPIRATORY_TRACT | 3 refills | Status: DC
  Filled 2023-01-21 – 2023-03-05 (×2): qty 10.2, 30d supply, fill #0
  Filled 2023-05-03: qty 10.2, 30d supply, fill #1
  Filled 2023-07-05: qty 10.2, 30d supply, fill #2
  Filled 2023-09-05: qty 10.2, 30d supply, fill #3

## 2023-01-21 NOTE — Assessment & Plan Note (Signed)
 Problem is stable. Continue Singulair 10 mg daily during Spring, when symptoms are more frequent. Zyrtec 10 mg daily as needed. Nasal saline irrigations as needed.

## 2023-01-21 NOTE — Patient Instructions (Addendum)
 A few things to remember from today's visit:  Asthma, intermittent, uncomplicated - Plan: albuterol  (VENTOLIN  HFA) 108 (90 Base) MCG/ACT inhaler, budesonide -formoterol  (SYMBICORT ) 160-4.5 MCG/ACT inhaler, montelukast  (SINGULAIR ) 10 MG tablet  Bee sting allergy - Plan: EPINEPHrine  0.3 mg/0.3 mL IJ SOAJ injection  Allergic rhinitis, unspecified seasonality, unspecified trigger - Plan: montelukast  (SINGULAIR ) 10 MG tablet  If you need refills for medications you take chronically, please call your pharmacy. Do not use My Chart to request refills or for acute issues that need immediate attention. If you send a my chart message, it may take a few days to be addressed, specially if I am not in the office.  Please be sure medication list is accurate. If a new problem present, please set up appointment sooner than planned today.

## 2023-01-21 NOTE — Assessment & Plan Note (Signed)
 Has not had episodes since his last visit. Epi pen sent.

## 2023-01-21 NOTE — Assessment & Plan Note (Signed)
 He has gained some wt since his last visit in 09/2021. He understands the benefits of wt loss as well as adverse effects of obesity. Consistency with healthy diet and physical activity encouraged. He is not interested in establishing with Healthy Weight [facility] Wellness clinic.

## 2023-01-21 NOTE — Assessment & Plan Note (Signed)
 Problem is well controlled. Continue Symbicort 160-4.5 mcg twice daily and albuterol inhaler 2 puff every 4-6 hours as needed. Weight loss strongly recommended. Singulair 10 mg daily during Spring. F/U in a year as far as symptoms are stable.

## 2023-01-31 ENCOUNTER — Other Ambulatory Visit (HOSPITAL_COMMUNITY): Payer: Self-pay

## 2023-03-05 ENCOUNTER — Other Ambulatory Visit (HOSPITAL_COMMUNITY): Payer: Self-pay

## 2023-05-03 ENCOUNTER — Other Ambulatory Visit (HOSPITAL_COMMUNITY): Payer: Self-pay

## 2023-05-13 ENCOUNTER — Other Ambulatory Visit (HOSPITAL_COMMUNITY): Payer: Self-pay

## 2023-07-05 ENCOUNTER — Other Ambulatory Visit (HOSPITAL_COMMUNITY): Payer: Self-pay

## 2023-09-05 ENCOUNTER — Other Ambulatory Visit (HOSPITAL_COMMUNITY): Payer: Self-pay

## 2023-10-11 ENCOUNTER — Other Ambulatory Visit: Payer: Self-pay | Admitting: Family Medicine

## 2023-10-11 ENCOUNTER — Other Ambulatory Visit (HOSPITAL_COMMUNITY): Payer: Self-pay

## 2023-10-11 DIAGNOSIS — J4520 Mild intermittent asthma, uncomplicated: Secondary | ICD-10-CM

## 2023-10-11 MED ORDER — BUDESONIDE-FORMOTEROL FUMARATE 160-4.5 MCG/ACT IN AERO
2.0000 | INHALATION_SPRAY | Freq: Two times a day (BID) | RESPIRATORY_TRACT | 3 refills | Status: AC
  Filled 2023-10-11: qty 10.2, 30d supply, fill #0
  Filled 2023-12-17: qty 10.2, 30d supply, fill #1
  Filled 2024-02-14: qty 10.2, 30d supply, fill #0

## 2023-12-17 ENCOUNTER — Other Ambulatory Visit: Payer: Self-pay

## 2023-12-17 ENCOUNTER — Other Ambulatory Visit: Payer: Self-pay | Admitting: Family Medicine

## 2023-12-17 ENCOUNTER — Other Ambulatory Visit (HOSPITAL_COMMUNITY): Payer: Self-pay

## 2023-12-17 DIAGNOSIS — J309 Allergic rhinitis, unspecified: Secondary | ICD-10-CM

## 2023-12-17 DIAGNOSIS — J4520 Mild intermittent asthma, uncomplicated: Secondary | ICD-10-CM

## 2023-12-18 ENCOUNTER — Other Ambulatory Visit: Payer: Self-pay

## 2023-12-18 ENCOUNTER — Other Ambulatory Visit (HOSPITAL_COMMUNITY): Payer: Self-pay

## 2023-12-18 MED ORDER — MONTELUKAST SODIUM 10 MG PO TABS
10.0000 mg | ORAL_TABLET | Freq: Every day | ORAL | 1 refills | Status: AC
  Filled 2023-12-18: qty 90, 90d supply, fill #0

## 2024-01-15 ENCOUNTER — Other Ambulatory Visit (HOSPITAL_COMMUNITY): Payer: Self-pay

## 2024-02-14 ENCOUNTER — Other Ambulatory Visit (HOSPITAL_COMMUNITY): Payer: Self-pay
